# Patient Record
Sex: Female | Born: 1975
Health system: Southern US, Community
[De-identification: ages and names within clinical notes are randomized; demographics above are authoritative.]

## PROBLEM LIST (undated history)

## (undated) HISTORY — PX: BREAST SURGERY: SHX581

## (undated) HISTORY — PX: COSMETIC SURGERY: SHX468

---

## 2004-11-25 ENCOUNTER — Inpatient Hospital Stay: Payer: Self-pay | Admitting: Obstetrics & Gynecology

## 2005-03-08 ENCOUNTER — Inpatient Hospital Stay: Payer: Self-pay | Admitting: Psychiatry

## 2005-03-18 ENCOUNTER — Inpatient Hospital Stay: Payer: Self-pay | Admitting: Psychiatry

## 2011-08-26 ENCOUNTER — Ambulatory Visit (INDEPENDENT_AMBULATORY_CARE_PROVIDER_SITE_OTHER): Payer: Self-pay | Admitting: Internal Medicine

## 2011-08-26 ENCOUNTER — Encounter: Payer: Self-pay | Admitting: Internal Medicine

## 2011-08-26 ENCOUNTER — Other Ambulatory Visit (HOSPITAL_COMMUNITY)
Admission: RE | Admit: 2011-08-26 | Discharge: 2011-08-26 | Disposition: A | Discharge status: Home / Self Care | Payer: Self-pay | Source: Ambulatory Visit | Attending: Internal Medicine | Admitting: Internal Medicine

## 2011-08-26 ENCOUNTER — Telehealth: Payer: Self-pay | Admitting: Internal Medicine

## 2011-08-26 VITALS — BP 108/60 | HR 74 | Temp 97.8°F | Resp 16 | Ht 70.0 in | Wt 138.8 lb

## 2011-08-26 DIAGNOSIS — Z124 Encounter for screening for malignant neoplasm of cervix: Secondary | ICD-10-CM

## 2011-08-26 DIAGNOSIS — F32A Depression, unspecified: Secondary | ICD-10-CM

## 2011-08-26 DIAGNOSIS — Z0000 Encounter for general adult medical examination without abnormal findings: Secondary | ICD-10-CM

## 2011-08-26 DIAGNOSIS — Z01419 Encounter for gynecological examination (general) (routine) without abnormal findings: Secondary | ICD-10-CM | POA: Insufficient documentation

## 2011-08-26 DIAGNOSIS — F329 Major depressive disorder, single episode, unspecified: Secondary | ICD-10-CM

## 2011-08-26 DIAGNOSIS — Z1159 Encounter for screening for other viral diseases: Secondary | ICD-10-CM | POA: Insufficient documentation

## 2011-08-26 MED ORDER — FLUOXETINE HCL 60 MG PO TABS: 60.0000 mg | ORAL_TABLET | Freq: Every day | ORAL | Status: DC

## 2011-08-26 MED ORDER — FLUOXETINE HCL 60 MG PO TABS: 20.0000 mg | ORAL_TABLET | Freq: Every day | ORAL | Status: DC

## 2011-08-26 NOTE — Telephone Encounter (Signed)
60 mg is her dose.  New rx sent.  Thank you

## 2011-08-26 NOTE — Telephone Encounter (Signed)
Caller: Crystal; Pharmacy: [facility] Phone Number: [phone]; Prescriber: Dr. Darrick Huntsman; Medication(s): Fluoxetine 60mg , dose 20mg , #90, Fluoxetine 20mg  tab 1 daily. Pt was taking Fluoxetine 60mg  daily. Pls call to verify dose. Capsules requested.

## 2011-08-26 NOTE — Progress Notes (Signed)
Patient ID: [name], female   DOB: 10-29-1975, 36 y.o.   MRN: [id number]  Patient Active Problem List  Diagnoses  . Depression  . Annual physical exam    Subjective:  CC:   Chief Complaint  Patient presents with  . Gynecologic Exam    HPI:   Kendra Mckinney a 36 y.o. female who presents  History reviewed. No pertinent past medical history.  History reviewed. No pertinent past surgical history.       The following portions of the patient's history were reviewed and updated as appropriate: Allergies, current medications, and problem list.    Review of Systems:   12 Pt  review of systems was negative except those addressed in the HPI,     History   Social History  . Marital Status: Married    Spouse Name: N/A    Number of Children: N/A  . Years of Education: N/A   Occupational History  . Not on file.   Social History Main Topics  . Smoking status: Never Smoker   . Smokeless tobacco: Never Used  . Alcohol Use: No  . Drug Use: No  . Sexually Active: Not on file   Other Topics Concern  . Not on file   Social History Narrative  . No narrative on file    Objective:  BP 108/60  Pulse 74  Temp(Src) 97.8 F (36.6 C) (Oral)  Resp 16  Ht 5\' 10"  (1.778 m)  Wt 138 lb 12 oz (62.937 kg)  BMI 19.91 kg/m2  SpO2 98%  LMP 08/19/2011  .BP 108/60  Pulse 74  Temp(Src) 97.8 F (36.6 C) (Oral)  Resp 16  Ht 5\' 10"  (1.778 m)  Wt 138 lb 12 oz (62.937 kg)  BMI 19.91 kg/m2  SpO2 98%  LMP 08/19/2011  General Appearance:    Alert, cooperative, no distress, appears stated age  Head:    Normocephalic, without obvious abnormality, atraumatic  Eyes:    PERRL, conjunctiva/corneas clear, EOM's intact, fundi    benign, both eyes  Ears:    Normal TM's and external ear canals, both ears  Nose:   Nares normal, septum midline, mucosa normal, no drainage    or sinus tenderness  Throat:   Lips, mucosa, and tongue normal; teeth and gums normal  Neck:   Supple, symmetrical,  trachea midline, no adenopathy;    thyroid:  no enlargement/tenderness/nodules; no carotid   bruit or JVD  Back:     Symmetric, no curvature, ROM normal, no CVA tenderness  Lungs:     Clear to auscultation bilaterally, respirations unlabored  Chest Wall:    No tenderness or deformity   Heart:    Regular rate and rhythm, S1 and S2 normal, no murmur, rub   or gallop  Breast Exam:    No tenderness, masses, or nipple abnormality  Abdomen:     Soft, non-tender, bowel sounds active all four quadrants,    no masses, no organomegaly  Genitalia:    Normal female without lesion, discharge or tenderness     Extremities:   Extremities normal, atraumatic, no cyanosis or edema  Pulses:   2+ and symmetric all extremities  Skin:   Skin color, texture, turgor normal, no rashes or lesions  Lymph nodes:   Cervical, supraclavicular, and axillary nodes normal  Neurologic:   CNII-XII intact, normal strength, sensation and reflexes    throughout    Assessment and Plan:  Depression Chronic, managed with prozac.  She has attempted to wean but  was unsuccessful  Annual physical exam PAP and breast exam were done today.     Updated Medication List Outpatient Encounter Prescriptions as of 08/26/2011  Medication Sig Dispense Refill  . DISCONTD: FLUoxetine (PROZAC) 20 MG tablet Take 20 mg by mouth daily.      Marland Kitchen DISCONTD: FLUoxetine 60 MG TABS Take 20 mg by mouth daily.  90 tablet  2     No orders of the defined types were placed in this encounter.    No Follow-up on file.

## 2011-08-26 NOTE — Patient Instructions (Signed)
claritin = loratidine Zyrtec = cetirizine Allegra = fexofenadine   Choose 1,  Use daily to prevention of allergy related symptoms   Use Simply Saline twice daily to lavage your sinuses and remove pollen from your nose

## 2011-08-29 ENCOUNTER — Encounter: Payer: Self-pay | Admitting: Internal Medicine

## 2011-08-29 DIAGNOSIS — F418 Other specified anxiety disorders: Secondary | ICD-10-CM | POA: Insufficient documentation

## 2011-08-29 DIAGNOSIS — Z0000 Encounter for general adult medical examination without abnormal findings: Secondary | ICD-10-CM | POA: Insufficient documentation

## 2011-08-29 DIAGNOSIS — F329 Major depressive disorder, single episode, unspecified: Secondary | ICD-10-CM | POA: Insufficient documentation

## 2011-08-29 NOTE — Assessment & Plan Note (Signed)
Chronic, managed with prozac.  She has attempted to wean but was unsuccessful

## 2011-08-29 NOTE — Assessment & Plan Note (Signed)
PAP and breast exam were done today.

## 2011-12-17 ENCOUNTER — Encounter: Payer: Self-pay | Admitting: Internal Medicine

## 2012-06-02 ENCOUNTER — Ambulatory Visit: Payer: Self-pay

## 2012-07-11 ENCOUNTER — Observation Stay: Payer: Self-pay | Admitting: Obstetrics and Gynecology

## 2012-07-11 LAB — PROTEIN / CREATININE RATIO, URINE
Creatinine, Urine: 39.9 mg/dL (ref 30.0–125.0)
Protein, Random Urine: 11 mg/dL (ref 0–12)
Protein/Creat. Ratio: 276 mg/gCREAT — ABNORMAL HIGH (ref 0–200)

## 2012-07-15 ENCOUNTER — Observation Stay: Payer: Self-pay | Admitting: Obstetrics & Gynecology

## 2012-07-15 ENCOUNTER — Inpatient Hospital Stay: Payer: Self-pay

## 2012-07-15 LAB — CBC WITH DIFFERENTIAL/PLATELET
Basophil #: 0.1 10*3/uL (ref 0.0–0.1)
Basophil %: 0.5 %
Eosinophil #: 0 10*3/uL (ref 0.0–0.7)
Eosinophil %: 0.3 %
HCT: 31.5 % — ABNORMAL LOW (ref 35.0–47.0)
HGB: 10.6 g/dL — ABNORMAL LOW (ref 12.0–16.0)
Lymphocyte #: 2.2 10*3/uL (ref 1.0–3.6)
MCH: 29.8 pg (ref 26.0–34.0)
MCV: 89 fL (ref 80–100)
Monocyte #: 1 x10 3/mm — ABNORMAL HIGH (ref 0.2–0.9)
Monocyte %: 6.5 %
Neutrophil %: 78.4 %
RBC: 3.55 10*6/uL — ABNORMAL LOW (ref 3.80–5.20)
RDW: 15.6 % — ABNORMAL HIGH (ref 11.5–14.5)
WBC: 15.3 10*3/uL — ABNORMAL HIGH (ref 3.6–11.0)

## 2012-07-17 LAB — HEMATOCRIT: HCT: 32.2 % — ABNORMAL LOW (ref 35.0–47.0)

## 2012-10-05 ENCOUNTER — Other Ambulatory Visit: Payer: Self-pay | Admitting: *Deleted

## 2012-10-05 DIAGNOSIS — F329 Major depressive disorder, single episode, unspecified: Secondary | ICD-10-CM

## 2012-10-09 NOTE — Telephone Encounter (Signed)
Please advise as to refill. 

## 2012-10-19 ENCOUNTER — Ambulatory Visit (INDEPENDENT_AMBULATORY_CARE_PROVIDER_SITE_OTHER): Payer: 59 | Admitting: Internal Medicine

## 2012-10-19 ENCOUNTER — Encounter: Payer: Self-pay | Admitting: Internal Medicine

## 2012-10-19 VITALS — BP 108/62 | HR 87 | Temp 98.2°F | Resp 16 | Wt 162.8 lb

## 2012-10-19 DIAGNOSIS — R05 Cough: Secondary | ICD-10-CM

## 2012-10-19 LAB — CBC WITH DIFFERENTIAL/PLATELET
Eosinophils Absolute: 0.3 10*3/uL (ref 0.0–0.7)
MCHC: 33.7 g/dL (ref 30.0–36.0)
MCV: 90.4 fl (ref 78.0–100.0)
Monocytes Absolute: 0.5 10*3/uL (ref 0.1–1.0)
Neutrophils Relative %: 43.7 % (ref 43.0–77.0)
Platelets: 265 10*3/uL (ref 150.0–400.0)

## 2012-10-19 MED ORDER — MOMETASONE FUROATE 50 MCG/ACT NA SUSP: NASAL | Status: DC

## 2012-10-19 MED ORDER — BENZONATATE 200 MG PO CAPS: 200.0000 mg | ORAL_CAPSULE | Freq: Three times a day (TID) | ORAL | Status: DC | PRN

## 2012-10-19 MED ORDER — MONTELUKAST SODIUM 10 MG PO TABS: 10.0000 mg | ORAL_TABLET | Freq: Every day | ORAL | Status: DC

## 2012-10-19 MED ORDER — PANTOPRAZOLE SODIUM 40 MG PO TBEC: 40.0000 mg | DELAYED_RELEASE_TABLET | Freq: Every day | ORAL | Status: DC

## 2012-10-19 NOTE — Patient Instructions (Addendum)
We are going to investigate your cough which can be caused by several (and any combination of) disorders:  Asthma Post nasal drip Seasonal allergies Reflux Medications   Serologies today for signs of allergies and hypersensitivities to molds Start singulair, protonix and nasonex Alternative to pronotix:  Nexium.  Omeprazole,  Aciphex,  Dexilant, prevacid (all are PPIs)  Tessalon capsules every 8 hours for cough   Repeat chest x ray to be done at [facility] office (no appt needed open 8 to 5)  Return in two weeks.  Pulmonary function tests if cough persists.

## 2012-10-19 NOTE — Progress Notes (Signed)
Patient ID: Kendra Mckinney, female   DOB: 04-11-1976, 37 y.o.   MRN: [id number]  Patient Active Problem List   Diagnosis Date Noted  . Cough 10/21/2012  . Depression 08/29/2011  . Annual physical exam 08/29/2011    Subjective:  CC:   Chief Complaint  Patient presents with  . Acute Visit    cough since 1/14 dry cough at first, then congestion, saw by GYN office.     HPI:   Kendra Mckinney a 37 y.o. female who presents persistent cough.  Has been coughing since January.  Positive sick contacts. daughter  was dxd with flu on Christmas Eve.  Patient was  pregnant  And treated by OB GYN  with tussionex, antibiotics and  albuterol inhaler for wheezing and persistent cough.  Had 3 treatments for perwsistent cough,  delivered march 23rd .  One month later developed body aches and flu like symptoms accompanied by  Fullness and ear  popping along with episodes of dizziness with near syncope.  Was treated for otitis media. X rays done in February at [facility] were normal, . So chest x ray was not repeated in March . Marland Kitchen Was treated with steroid taper, abx, and allegra d.  No serology done for whooping cough because she was up to date on TDaP. Pregnancy was uncomplicated.    For the past 2 months has noted blood streaked nasal drainage and paroxysms of productive cough. Cough is brought on by lying down.,  But lots of nasal drainage.   Stopped allegra D due to cost one week ago. No significant change but having headaches daily releived by ibuprofen.  LNs have been swollen.    Has history of seasonal allergies,  Since teenage used claritin d , but for the past few years only in springtime.  No allergy testing in years. Has had cats for years.  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 No recent travel , No history of smoking     History reviewed. No pertinent past medical history.  History reviewed. No pertinent past surgical history.     The  following portions of the patient's history were reviewed and updated as appropriate: Allergies, current medications, and problem list.    Review of Systems:   12 Pt  review of systems was negative except those addressed in the HPI,     History   Social History  . Marital Status: Married    Spouse Name: N/A    Number of Children: N/A  . Years of Education: N/A   Occupational History  . Not on file.   Social History Main Topics  . Smoking status: Never Smoker   . Smokeless tobacco: Never Used  . Alcohol Use: No  . Drug Use: No  . Sexually Active: Not on file   Other Topics Concern  . Not on file   Social History Narrative  . No narrative on file    Objective:  BP 108/62  Pulse 87  Temp(Src) 98.2 F (36.8 C) (Oral)  Resp 16  Wt 162 lb 12 oz (73.823 kg)  BMI 23.35 kg/m2  SpO2 99%  LMP 09/17/2012  General appearance: alert, cooperative and appears stated age Ears: normal TM's and external ear canals both ears Throat: lips, mucosa, and tongue normal; teeth and gums normal Neck: no adenopathy, no carotid bruit, supple, symmetrical, trachea midline and thyroid not enlarged, symmetric, no tenderness/mass/nodules Back: symmetric, no curvature. ROM normal.  No CVA tenderness. Lungs: clear to auscultation bilaterally Heart: regular rate and rhythm, S1, S2 normal, no murmur, click, rub or gallop Abdomen: soft, non-tender; bowel sounds normal; no masses,  no organomegaly Pulses: 2+ and symmetric Skin: Skin color, texture, turgor normal. No rashes or lesions Lymph nodes: Cervical, supraclavicular, and axillary nodes normal.  Assessment and Plan:  Cough Etiology unclear and may include cyclic cough ,  GERD, cardiomyopathy and asthma.  Hypersensitivity panel, CXR, PPI, antihistamine and cough suppressant .  Return in 2 weeks and order pulmonary function tests if persistent.    Updated Medication List Outpatient Encounter Prescriptions as of 10/19/2012  Medication Sig  Dispense Refill  . FLUoxetine HCl 60 MG TABS Take 60 mg by mouth daily.  90 tablet  2  . benzonatate (TESSALON) 200 MG capsule Take 1 capsule (200 mg total) by mouth 3 (three) times daily as needed for cough.  60 capsule  0  . mometasone (NASONEX) 50 MCG/ACT nasal spray 2 squirts in each nostril once a day  17 g  12  . montelukast (SINGULAIR) 10 MG tablet Take 1 tablet (10 mg total) by mouth at bedtime.  30 tablet  3  . pantoprazole (PROTONIX) 40 MG tablet Take 1 tablet (40 mg total) by mouth daily.  30 tablet  3   No facility-administered encounter medications on file as of 10/19/2012.     Orders Placed This Encounter  Procedures  . DG Chest 2 View  . CBC with Differential  . Hypersensitivity pnuemonitis profile    Return in about 2 weeks (around 11/02/2012).

## 2012-10-21 ENCOUNTER — Encounter: Payer: Self-pay | Admitting: Internal Medicine

## 2012-10-21 DIAGNOSIS — R059 Cough, unspecified: Secondary | ICD-10-CM | POA: Insufficient documentation

## 2012-10-21 DIAGNOSIS — R05 Cough: Secondary | ICD-10-CM | POA: Insufficient documentation

## 2012-10-21 NOTE — Assessment & Plan Note (Signed)
Etiology unclear and may include cyclic cough ,  GERD, cardiomyopathy and asthma.  Hypersensitivity panel, CXR, PPI, antihistamine and cough suppressant .  Return in 2 weeks and order pulmonary function tests if persistent.

## 2012-10-25 LAB — HYPERSENSITIVITY PNUEMONITIS PROFILE

## 2012-10-30 ENCOUNTER — Ambulatory Visit (INDEPENDENT_AMBULATORY_CARE_PROVIDER_SITE_OTHER)
Admission: RE | Admit: 2012-10-30 | Discharge: 2012-10-30 | Disposition: A | Discharge status: Home / Self Care | Payer: 59 | Source: Ambulatory Visit | Attending: Internal Medicine | Admitting: Internal Medicine

## 2012-10-30 ENCOUNTER — Telehealth: Payer: Self-pay | Admitting: Internal Medicine

## 2012-10-30 DIAGNOSIS — R05 Cough: Secondary | ICD-10-CM

## 2012-10-30 NOTE — Telephone Encounter (Signed)
Returning your call. °

## 2012-11-01 NOTE — Telephone Encounter (Signed)
Patient C\O cough  Still present has an appointment on 11/03/12. FYI

## 2012-11-03 ENCOUNTER — Encounter: Payer: Self-pay | Admitting: Internal Medicine

## 2012-11-03 ENCOUNTER — Ambulatory Visit (INDEPENDENT_AMBULATORY_CARE_PROVIDER_SITE_OTHER): Payer: 59 | Admitting: Internal Medicine

## 2012-11-03 VITALS — BP 102/58 | HR 85 | Temp 98.1°F | Resp 16 | Wt 161.5 lb

## 2012-11-03 DIAGNOSIS — R05 Cough: Secondary | ICD-10-CM

## 2012-11-03 DIAGNOSIS — J309 Allergic rhinitis, unspecified: Secondary | ICD-10-CM

## 2012-11-03 MED ORDER — HYDROCOD POLST-CHLORPHEN POLST 10-8 MG/5ML PO LQCR: 10.0000 mL | Freq: Every evening | ORAL | Status: DC | PRN

## 2012-11-03 MED ORDER — BUDESONIDE-FORMOTEROL FUMARATE 160-4.5 MCG/ACT IN AERO: 2.0000 | INHALATION_SPRAY | Freq: Two times a day (BID) | RESPIRATORY_TRACT | Status: DC

## 2012-11-03 NOTE — Progress Notes (Signed)
Patient ID: Kendra Mckinney, female   DOB: July 03, 1975, 37 y.o.   MRN: [id number]  Patient Active Problem List   Diagnosis Date Noted  . Cough 10/21/2012  . Depression 08/29/2011  . Annual physical exam 08/29/2011    Subjective:  CC:   Chief Complaint  Patient presents with  . Follow-up    Not as congested today as last two days, patient reports cough productive    HPI:   Kendra Mckinney a 37 y.o. female who presents 2 week follow up on treatment and initial evaluatoin of persistent cough for the last 7 months.  She was treated with singulair, protonix and nasonex.   Reports compliance with singulair and protonix, tessalon but did not use nasonex regularly .CXR was normal,  Hypersensitivity pneumonitis profile was negative,  CBC showed eosinophilia.  She reports no significant change,  Feels like she is still cycling between cough productive of clear sputum followed by cough productive of purulent sputum which began again after developing sinus congestion . COUGH has been productive of discolored sputum for the last 4 days and s severe at times she chokes. She denies facial pain, body aches and fevers. States that she has not had a fever at all during the 7 months of symptoms, but was still treated with antibiotics multiple times by other providers (ENT and Urgent Care) and symptoms would intermittently improve but always return.   She has been using the albuterol inhaler for symptoms of heavy chest which seems to improve her breathing.   Daughter has asthma. She reports that the cough is often triggered by sitting in front of cold air vent, exposure to perfume, tobacco smoke and fumes in someone's car.      History reviewed. No pertinent past medical history.  History reviewed. No pertinent past surgical history.     The following portions of the patient's history were reviewed and updated as appropriate: Allergies, current medications, and problem list.    Review of Systems:   12 Pt  review  of systems was negative except those addressed in the HPI,     History   Social History  . Marital Status: Married    Spouse Name: N/A    Number of Children: N/A  . Years of Education: N/A   Occupational History  . Not on file.   Social History Main Topics  . Smoking status: Never Smoker   . Smokeless tobacco: Never Used  . Alcohol Use: No  . Drug Use: No  . Sexually Active: Not on file   Other Topics Concern  . Not on file   Social History Narrative  . No narrative on file    Objective:  BP 102/58  Pulse 85  Temp(Src) 98.1 F (36.7 C) (Oral)  Resp 16  Wt 161 lb 8 oz (73.256 kg)  BMI 23.17 kg/m2  SpO2 98%  LMP 09/17/2012  General appearance: alert, cooperative and appears stated age Ears: normal TM's and external ear canals both ears Throat: lips, mucosa, and tongue normal; teeth and gums normal Neck: no adenopathy, no carotid bruit, supple, symmetrical, trachea midline and thyroid not enlarged, symmetric, no tenderness/mass/nodules Back: symmetric, no curvature. ROM normal. No CVA tenderness. Lungs: clear to auscultation bilaterally Heart: regular rate and rhythm, S1, S2 normal, no murmur, click, rub or gallop Abdomen: soft, non-tender; bowel sounds normal; no masses,  no organomegaly Pulses: 2+ and symmetric Skin: Skin color, texture, turgor normal. No rashes or lesions Lymph nodes: Cervical, supraclavicular, and axillary nodes normal.  Assessment and Plan:  Cough Suspect asthma given personal history FH of asthma and eosinophilia on  CBC.  Allergy testing and PFTS ordered,  Continue singulair,  Samples of symbicort given to use for 2 week. Tussionex for nighttime cough,  continue PPI    Updated Medication List Outpatient Encounter Prescriptions as of 11/03/2012  Medication Sig Dispense Refill  . benzonatate (TESSALON) 200 MG capsule Take 1 capsule (200 mg total) by mouth 3 (three) times daily as needed for cough.  60 capsule  0  . FLUoxetine HCl 60 MG  TABS Take 60 mg by mouth daily.  90 tablet  2  . mometasone (NASONEX) 50 MCG/ACT nasal spray 2 squirts in each nostril once a day  17 g  12  . montelukast (SINGULAIR) 10 MG tablet Take 1 tablet (10 mg total) by mouth at bedtime.  30 tablet  3  . pantoprazole (PROTONIX) 40 MG tablet Take 1 tablet (40 mg total) by mouth daily.  30 tablet  3  . budesonide-formoterol (SYMBICORT) 160-4.5 MCG/ACT inhaler Inhale 2 puffs into the lungs 2 (two) times daily.  1 Inhaler  12  . chlorpheniramine-HYDROcodone (TUSSIONEX) 10-8 MG/5ML LQCR Take 10 mLs by mouth at bedtime as needed.  240 mL  0   No facility-administered encounter medications on file as of 11/03/2012.     Orders Placed This Encounter  Procedures  . Ambulatory referral to Allergy  . Pulmonary function test    Return in about 2 weeks (around 11/17/2012).

## 2012-11-03 NOTE — Patient Instructions (Addendum)
Continue Singulair and nasonex as directed.  Sudafed PE for the congestion  Tussionex at bedtime for cough and post nasal drip  Referral to Allergist ,  And to [facility] for pulmonary function tests   Trial of symbicort inhaler:  2 puffs twice daily

## 2012-11-05 ENCOUNTER — Encounter: Payer: Self-pay | Admitting: Internal Medicine

## 2012-11-05 NOTE — Assessment & Plan Note (Addendum)
Suspect asthma given personal history FH of asthma and eosinophilia on  CBC.  Allergy testing and PFTS ordered,  Continue singulair,  Samples of symbicort given to use for 2 week. Tussionex for nighttime cough,  continue PPI    

## 2012-11-09 ENCOUNTER — Encounter: Payer: Self-pay | Admitting: Internal Medicine

## 2012-11-14 ENCOUNTER — Ambulatory Visit: Payer: Self-pay | Admitting: Internal Medicine

## 2012-11-16 ENCOUNTER — Telehealth: Payer: Self-pay | Admitting: Internal Medicine

## 2012-11-16 DIAGNOSIS — J45909 Unspecified asthma, uncomplicated: Secondary | ICD-10-CM | POA: Insufficient documentation

## 2012-11-16 DIAGNOSIS — J4520 Mild intermittent asthma, uncomplicated: Secondary | ICD-10-CM

## 2012-11-16 NOTE — Telephone Encounter (Signed)
Her pulmonary function tests suggested that she does have asthma which is responsive to bronchodilator therapy. sHe should continue to use Symbicort Nasonex and Singulair as directed . She needs to have a pro aire inhaler for emergency use. If she would like to see a pulmonologist I will refer her to Dr. Kendrick Fries.

## 2012-12-04 ENCOUNTER — Encounter: Payer: Self-pay | Admitting: Internal Medicine

## 2013-07-04 ENCOUNTER — Telehealth: Payer: Self-pay | Admitting: *Deleted

## 2013-07-04 NOTE — Telephone Encounter (Signed)
Left message for pt to return my call. Chart shows she had been using 60 mg tabs once daily, refill request for 20 mg caps tid. Pt also needs appt with Dr. Darrick Huntsmanullo.

## 2013-07-04 NOTE — Telephone Encounter (Signed)
Refill request  Prozac 20 mg cap  #90  Take three capsules by mouth once daily

## 2013-07-05 NOTE — Telephone Encounter (Signed)
FYI in case you get returned call

## 2013-07-12 NOTE — Telephone Encounter (Signed)
Left message for patient to call office on mobile phone.

## 2013-09-04 ENCOUNTER — Telehealth: Payer: Self-pay | Admitting: Internal Medicine

## 2013-09-04 DIAGNOSIS — F32A Depression, unspecified: Secondary | ICD-10-CM

## 2013-09-04 DIAGNOSIS — F329 Major depressive disorder, single episode, unspecified: Secondary | ICD-10-CM

## 2013-09-04 MED ORDER — FLUOXETINE HCL 60 MG PO TABS: 60.0000 mg | ORAL_TABLET | Freq: Every day | ORAL | Status: DC

## 2013-09-04 NOTE — Telephone Encounter (Signed)
Last 2 visits, 7/14 and 6/14 acute visits, refill or not until appt 09/18/13?

## 2013-09-04 NOTE — Telephone Encounter (Signed)
The patient has set up an appointment for 5.26.15. She is asking if her prescription for FLUoxetine (PROZAC) 20 MG tablet could be called to the pharmacy.

## 2013-09-04 NOTE — Telephone Encounter (Signed)
Ok to refill,  Refill sent  

## 2013-09-18 ENCOUNTER — Encounter: Payer: Self-pay | Admitting: Internal Medicine

## 2013-09-18 ENCOUNTER — Ambulatory Visit (INDEPENDENT_AMBULATORY_CARE_PROVIDER_SITE_OTHER): Payer: No Typology Code available for payment source | Admitting: Internal Medicine

## 2013-09-18 VITALS — BP 102/62 | HR 88 | Temp 98.2°F | Resp 16 | Wt 157.0 lb

## 2013-09-18 DIAGNOSIS — J0100 Acute maxillary sinusitis, unspecified: Secondary | ICD-10-CM

## 2013-09-18 DIAGNOSIS — R5383 Other fatigue: Secondary | ICD-10-CM

## 2013-09-18 DIAGNOSIS — R5381 Other malaise: Secondary | ICD-10-CM

## 2013-09-18 DIAGNOSIS — J45909 Unspecified asthma, uncomplicated: Secondary | ICD-10-CM

## 2013-09-18 DIAGNOSIS — F329 Major depressive disorder, single episode, unspecified: Secondary | ICD-10-CM

## 2013-09-18 DIAGNOSIS — F3289 Other specified depressive episodes: Secondary | ICD-10-CM

## 2013-09-18 DIAGNOSIS — E785 Hyperlipidemia, unspecified: Secondary | ICD-10-CM

## 2013-09-18 DIAGNOSIS — F32A Depression, unspecified: Secondary | ICD-10-CM

## 2013-09-18 LAB — CBC WITH DIFFERENTIAL/PLATELET
BASOS ABS: 0 10*3/uL (ref 0.0–0.1)
Basophils Relative: 0.8 % (ref 0.0–3.0)
EOS PCT: 2.4 % (ref 0.0–5.0)
Eosinophils Absolute: 0.1 10*3/uL (ref 0.0–0.7)
HEMATOCRIT: 39.1 % (ref 36.0–46.0)
Hemoglobin: 13 g/dL (ref 12.0–15.0)
LYMPHS PCT: 30.4 % (ref 12.0–46.0)
Lymphs Abs: 1.4 10*3/uL (ref 0.7–4.0)
MCHC: 33.3 g/dL (ref 30.0–36.0)
MCV: 91.6 fl (ref 78.0–100.0)
Monocytes Absolute: 0.6 10*3/uL (ref 0.1–1.0)
Monocytes Relative: 12.4 % — ABNORMAL HIGH (ref 3.0–12.0)
NEUTROS PCT: 54 % (ref 43.0–77.0)
Neutro Abs: 2.6 10*3/uL (ref 1.4–7.7)
PLATELETS: 240 10*3/uL (ref 150.0–400.0)
RBC: 4.26 Mil/uL (ref 3.87–5.11)
RDW: 13.5 % (ref 11.5–15.5)
WBC: 4.8 10*3/uL (ref 4.0–10.5)

## 2013-09-18 LAB — COMPREHENSIVE METABOLIC PANEL
ALK PHOS: 59 U/L (ref 39–117)
ALT: 21 U/L (ref 0–35)
AST: 28 U/L (ref 0–37)
Albumin: 4 g/dL (ref 3.5–5.2)
BILIRUBIN TOTAL: 0.2 mg/dL (ref 0.2–1.2)
BUN: 15 mg/dL (ref 6–23)
CALCIUM: 9.5 mg/dL (ref 8.4–10.5)
CO2: 29 mEq/L (ref 19–32)
CREATININE: 0.7 mg/dL (ref 0.4–1.2)
Chloride: 103 mEq/L (ref 96–112)
GFR: 96.83 mL/min (ref >60.00)
Glucose, Bld: 78 mg/dL (ref 70–99)
Potassium: 4.6 mEq/L (ref 3.5–5.1)
Sodium: 139 mEq/L (ref 135–145)
Total Protein: 7.6 g/dL (ref 6.0–8.3)

## 2013-09-18 LAB — LIPID PANEL
Cholesterol: 209 mg/dL — ABNORMAL HIGH (ref 0–200)
HDL: 47.7 mg/dL (ref >39.00)
LDL Cholesterol: 142 mg/dL — ABNORMAL HIGH (ref 0–99)
TRIGLYCERIDES: 95 mg/dL (ref 0.0–149.0)
Total CHOL/HDL Ratio: 4
VLDL: 19 mg/dL (ref 0.0–40.0)

## 2013-09-18 LAB — TSH: TSH: 1.7 u[IU]/mL (ref 0.35–4.50)

## 2013-09-18 MED ORDER — MONTELUKAST SODIUM 10 MG PO TABS: 10.0000 mg | ORAL_TABLET | Freq: Every day | ORAL | Status: DC

## 2013-09-18 MED ORDER — LEVOFLOXACIN 500 MG PO TABS: 500.0000 mg | ORAL_TABLET | Freq: Every day | ORAL | Status: DC

## 2013-09-18 MED ORDER — FEXOFENADINE-PSEUDOEPHED ER 180-240 MG PO TB24: 1.0000 | ORAL_TABLET | Freq: Every day | ORAL | Status: DC

## 2013-09-18 MED ORDER — PREDNISONE (PAK) 10 MG PO TABS: ORAL_TABLET | ORAL | Status: DC

## 2013-09-18 NOTE — Assessment & Plan Note (Signed)
Levaquin, predniosne,  Allegra D

## 2013-09-18 NOTE — Addendum Note (Signed)
Addended by: Montine Circle D on: 09/18/2013 01:59 PM   Modules accepted: Orders

## 2013-09-18 NOTE — Patient Instructions (Signed)
You have a sinus infection   .  I am prescribing an antibiotic (levaquin ) and prednisone taper  To manage the infection and the inflammation in your sinuses.   Allegra D once daily  For allergies and congestion singulair for both allergic rhinitis and asthma  We may need to start Symbicort on a daily basis if you are reaching for albuterol more than once a month    Gargle with salt water as needed for sore throat.   Use vicodin for ear pain

## 2013-09-18 NOTE — Assessment & Plan Note (Signed)
She is currently not wheezing and has not used inhaler in several weeks.  Resume singulair,  Prednisone taper for sinusitis,  And warned her that we may need to resume daily Symbicort for use of MDI > 1/month

## 2013-09-18 NOTE — Assessment & Plan Note (Signed)
Suspect asthma given personal history FH of asthma and eosinophilia on  CBC.  Allergy testing and PFTS ordered,  Continue singulair,  Samples of symbicort given to use for 2 week. Tussionex for nighttime cough,  continue PPI

## 2013-09-18 NOTE — Progress Notes (Signed)
Patient ID: Kendra Mckinney, female   DOB: 11/27/1975, 38 y.o.   MRN: [id number]   Patient Active Problem List   Diagnosis Date Noted  . Sinusitis, acute maxillary 09/18/2013  . 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 Cough 10/21/2012  . Depression 08/29/2011  . Annual physical exam 08/29/2011    Subjective:  CC:   Chief Complaint  Patient presents with  . Sinusitis  . Medication Refill    HPI:   Kendra Mckinney is a 38 y.o. female who presents for URI.  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  Her allergies have been really severe this year.    It started with chest tightness and heaviness, which she treated with albuterol no significant change ,and the symptoms lasted for 2 weeks.  No fevers,   Dry cough.  Sinuses congestion and draining blood tinged purulent drainage. Body aches,  No appetite.   History of asthma diagnosed by PFTs last July for recurrent wheezing/cough. Has not used any inhalers since July  until just recently   No past medical history on file.  No past surgical history on file.     The following portions of the patient's history were reviewed and updated as appropriate: Allergies, current medications, and problem list.    Review of Systems:   Patient denies headache, fevers, malaise, unintentional weight loss, skin rash, eye pain, sinus congestion and sinus pain, sore throat, dysphagia,  hemoptysis , cough, dyspnea, wheezing, chest pain, palpitations, orthopnea, edema, abdominal pain, nausea, melena, diarrhea, constipation, flank pain, dysuria, hematuria, urinary  Frequency, nocturia, numbness, tingling, seizures,  Focal weakness, Loss of consciousness,  Tremor, insomnia, depression, anxiety, and suicidal ideation.     History   Social History  . Marital Status: Married    Spouse Name: N/A    Number of Children: N/A  . Years of Education: N/A   Occupational History  . Not on file.   Social  History Main Topics  . Smoking status: Never Smoker   . Smokeless tobacco: Never Used  . Alcohol Use: No  . Drug Use: No  . Sexual Activity: Not on file   Other Topics Concern  . Not on file   Social History Narrative  . No narrative on file    Objective:  Filed Vitals:   09/18/13 1302  BP: 102/62  Pulse: 88  Temp: 98.2 F (36.8 C)  Resp: 16     General appearance: alert, cooperative and appears stated age Ears: normal TM's and external ear canals both ears Throat: lips, mucosa, and tongue normal; teeth and gums normal Neck: no adenopathy, no carotid bruit, supple, symmetrical, trachea midline and thyroid not enlarged, symmetric, no tenderness/mass/nodules Back: symmetric, no curvature. ROM normal. No CVA tenderness. Lungs: clear to auscultation bilaterally Heart: regular rate and rhythm, S1, S2 normal, no murmur, click, rub or gallop Abdomen: soft, non-tender; bowel sounds normal; no masses,  no organomegaly Pulses: 2+ and symmetric Skin: Skin color, texture, turgor normal. No rashes or lesions Lymph nodes: Cervical, supraclavicular, and axillary nodes normal.  Assessment and Plan:  Intrinsic asthma She is currently not wheezing and has not used inhaler in several weeks.  Resume singulair,  Prednisone taper for sinusitis,  And warned her that we may need to resume daily Symbicort for use of MDI > 1/month  Sinusitis, acute maxillary Levaquin, predniosne,  Allegra D  Depression Suspect asthma given personal history FH of asthma and eosinophilia on  CBC.  Allergy testing and PFTS ordered,  Continue singulair,  Samples of symbicort given to use for 2 week. Tussionex for nighttime cough,  continue PPI      Updated Medication List Outpatient Encounter Prescriptions as of 09/18/2013  Medication Sig  . FLUoxetine HCl 60 MG TABS Take 60 mg by mouth daily.  . fexofenadine-pseudoephedrine (ALLEGRA-D ALLERGY & CONGESTION) 180-240 MG per 24 hr tablet Take 1 tablet by mouth  daily.  Marland Kitchen levofloxacin (LEVAQUIN) 500 MG tablet Take 1 tablet (500 mg total) by mouth daily.  . montelukast (SINGULAIR) 10 MG tablet Take 1 tablet (10 mg total) by mouth at bedtime.  . predniSONE (STERAPRED UNI-PAK) 10 MG tablet 6 tablets on Day 1 , then reduce by 1 tablet daily until gone  . [DISCONTINUED] benzonatate (TESSALON) 200 MG capsule Take 1 capsule (200 mg total) by mouth 3 (three) times daily as needed for cough.  . [DISCONTINUED] budesonide-formoterol (SYMBICORT) 160-4.5 MCG/ACT inhaler Inhale 2 puffs into the lungs 2 (two) times daily.  . [DISCONTINUED] chlorpheniramine-HYDROcodone (TUSSIONEX) 10-8 MG/5ML LQCR Take 10 mLs by mouth at bedtime as needed.  . [DISCONTINUED] mometasone (NASONEX) 50 MCG/ACT nasal spray 2 squirts in each nostril once a day  . [DISCONTINUED] montelukast (SINGULAIR) 10 MG tablet Take 1 tablet (10 mg total) by mouth at bedtime.  . [DISCONTINUED] pantoprazole (PROTONIX) 40 MG tablet Take 1 tablet (40 mg total) by mouth daily.     Orders Placed This Encounter  Procedures  . Comprehensive metabolic panel  . CBC with Differential  . TSH  . Lipid panel    No Follow-up on file.

## 2013-09-18 NOTE — Progress Notes (Signed)
Pre visit review using our clinic review tool, if applicable. No additional management support is needed unless otherwise documented below in the visit note. 

## 2013-09-19 ENCOUNTER — Telehealth: Payer: Self-pay | Admitting: *Deleted

## 2013-09-19 NOTE — Telephone Encounter (Signed)
Patient voiced understanding.

## 2013-09-19 NOTE — Telephone Encounter (Signed)
There is a theoretical risk of tendinopathy which is what they are probably warning her about,  But i have prescribed levaquin and prednisone simultaneously  hundreds of times  With no problems.

## 2013-09-19 NOTE — Telephone Encounter (Signed)
Patient left message she read the package insert on the antibiotic prescribed and stated not to take with prednisone, Please advise should patient continue the antibiotic.

## 2013-09-20 ENCOUNTER — Encounter: Payer: Self-pay | Admitting: *Deleted

## 2013-09-24 ENCOUNTER — Telehealth: Payer: Self-pay | Admitting: Internal Medicine

## 2013-09-24 MED ORDER — AMOXICILLIN-POT CLAVULANATE 875-125 MG PO TABS: 1.0000 | ORAL_TABLET | Freq: Two times a day (BID) | ORAL | Status: DC

## 2013-09-24 MED ORDER — GUAIFENESIN-CODEINE 100-10 MG/5ML PO SYRP: 5.0000 mL | ORAL_SOLUTION | Freq: Three times a day (TID) | ORAL | Status: DC | PRN

## 2013-09-24 NOTE — Telephone Encounter (Signed)
Patient stated congestion is worse having headaches, Mucus has turned green in color. Finished steroids today and the antibiotic the patient stopped on Friday due to becoming very nauseated.  Patient coughing while talking on phone very hacking cough. Patient feels like its up in her head can't sleep, pressure around eyes and headaches patient afebrile but has chills.

## 2013-09-24 NOTE — Telephone Encounter (Signed)
Patient needing more medication for sinus infection.

## 2013-09-24 NOTE — Telephone Encounter (Signed)
augmentin will not treat congestion but will replace the levaquin ,  Needs to continue the allegra D Send rx for cheratussin cough syrup to pharmacy

## 2013-09-25 NOTE — Telephone Encounter (Signed)
Left message for pt to return my call. Rx faxed to pharmacy

## 2014-02-26 ENCOUNTER — Other Ambulatory Visit: Payer: Self-pay | Admitting: Internal Medicine

## 2014-08-31 ENCOUNTER — Other Ambulatory Visit: Payer: Self-pay | Admitting: Internal Medicine

## 2014-09-02 NOTE — Telephone Encounter (Signed)
Left message on VM to return call for appoint 

## 2014-09-03 NOTE — H&P (Signed)
L&D Evaluation:  History:  HPI 39 yo G2P1001 [facility] presents with c/o strong regular contractions since 9PM tonight. Onset of contractions early this AM. Was evaluated for labor  this AM and released when cx remained 3cm and ctxs remained mild. No LOF or bleeding. [facility] with a negative first trimester screen, hx of depression/anxiety since age 89, which worsened after her first baby requiring hospitalization. She was taking prozac 60 mgm prior to current pregnancy, but has not been on meds during pregnancy. Was treated for bronchitis at 32 weeks. Hx of SVD at 38 weeks in 2006, delivering a 5-4 baby.  No, lof, no VB, no ctx  [facility] for [facility], otherwise routine   Presents with contractions   Patient's Medical History depression, anxiety   Patient's Surgical History breast augmentation   Medications Pre Natal Vitamins   Allergies NKDA   Social History none   Family History Non-Contributory   ROS:  ROS see HPI   Exam:  Vital Signs stable   Urine Protein not completed   General breathing thru contractions   Mental Status clear   Chest clear   Heart normal sinus rhythm, no murmur/gallop/rubs   Abdomen gravid, tender with contractions   Estimated Fetal Weight Average for gestational age, 177 1/2#   Fetal Position cephalic   Edema 1+   Reflexes 3+   Pelvic no external lesions, 5cm/90% per RN exam   Mebranes Intact   FHT normal rate with no decels, reactive-CAt1   Fetal Heart Rate 125   Ucx irregular, q2-7 min apart   Skin dry   Impression:  Impression IUP at 39 4/7 weeks in early active labor   Plan:  Plan EFM/NST, monitor contractions and for cervical change, epidural for pain.   Electronic Signatures: Trinna BalloonGutierrez, Abhay Godbolt L (CNM)  (Signed 22-Mar-14 23:31)  Authored: L&D Evaluation   Last Updated: 22-Mar-14 23:31 by Trinna BalloonGutierrez, Deshonda Cryderman L (CNM)

## 2014-09-03 NOTE — Telephone Encounter (Signed)
Refill one 30 days only.  Refill sent

## 2014-09-03 NOTE — H&P (Signed)
L&D Evaluation:  History:  HPI 39 yo G2P1001 [facility] presenting after experiencing decreased fetal movement yesterday, good fetal movement today.  No, lof, no VB, no ctx  [facility] for [facility], otherwise routine   Presents with decreased fetal movement   Patient's Medical History depression, anxiety   Patient's Surgical History breast augmentation   Medications Pre Natal Vitamins  tussionex   Allergies NKDA   Social History none   Family History Non-Contributory   ROS:  ROS All systems were reviewed.  HEENT, CNS, GI, GU, Respiratory, CV, Renal and Musculoskeletal systems were found to be normal.   Exam:  Vital Signs BP >140/90  initial BP 142 systolic, remained of serial BP's normotensive   Urine Protein Pr/Cr ratio pending   FHT normal rate with no decels, 120, moderate, +accels, no decels reactive NST   Ucx absent   Impression:  Impression decreased fetal movement   Plan:  Comments - reactive NST patient reassured - routine labor precautions - next appointment 3/20 at 10:00   Electronic Signatures for Addendum Section:  Lorrene ReidStaebler, Ronnette Rump M (MD) (Signed Addendum 18-Mar-14 17:36)  Pr/Cr ratio of 276 normal   Electronic Signatures: Lorrene ReidStaebler, Harolyn Cocker M (MD)  (Signed 18-Mar-14 17:08)  Authored: L&D Evaluation   Last Updated: 18-Mar-14 17:36 by Lorrene ReidStaebler, Rush Salce M (MD)

## 2014-09-03 NOTE — Telephone Encounter (Addendum)
Last OV 5.26.15, last refill 11.3.15. Pt returned call scheduled appoint for 6.1.16. Please advise refill

## 2014-09-25 ENCOUNTER — Other Ambulatory Visit (HOSPITAL_COMMUNITY)
Admission: RE | Admit: 2014-09-25 | Discharge: 2014-09-25 | Disposition: A | Discharge status: Home / Self Care | Payer: No Typology Code available for payment source | Source: Ambulatory Visit | Attending: Internal Medicine | Admitting: Internal Medicine

## 2014-09-25 ENCOUNTER — Ambulatory Visit (INDEPENDENT_AMBULATORY_CARE_PROVIDER_SITE_OTHER): Payer: No Typology Code available for payment source | Admitting: Internal Medicine

## 2014-09-25 ENCOUNTER — Encounter: Payer: Self-pay | Admitting: Internal Medicine

## 2014-09-25 VITALS — BP 114/66 | HR 101 | Temp 98.6°F | Resp 16 | Ht 69.5 in | Wt 157.0 lb

## 2014-09-25 DIAGNOSIS — L989 Disorder of the skin and subcutaneous tissue, unspecified: Secondary | ICD-10-CM

## 2014-09-25 DIAGNOSIS — Z0000 Encounter for general adult medical examination without abnormal findings: Secondary | ICD-10-CM

## 2014-09-25 DIAGNOSIS — N841 Polyp of cervix uteri: Secondary | ICD-10-CM | POA: Diagnosis not present

## 2014-09-25 DIAGNOSIS — Z1151 Encounter for screening for human papillomavirus (HPV): Secondary | ICD-10-CM | POA: Insufficient documentation

## 2014-09-25 DIAGNOSIS — F329 Major depressive disorder, single episode, unspecified: Secondary | ICD-10-CM

## 2014-09-25 DIAGNOSIS — Z1239 Encounter for other screening for malignant neoplasm of breast: Secondary | ICD-10-CM

## 2014-09-25 DIAGNOSIS — F32A Depression, unspecified: Secondary | ICD-10-CM

## 2014-09-25 DIAGNOSIS — N842 Polyp of vagina: Secondary | ICD-10-CM

## 2014-09-25 DIAGNOSIS — Z01419 Encounter for gynecological examination (general) (routine) without abnormal findings: Secondary | ICD-10-CM | POA: Insufficient documentation

## 2014-09-25 MED ORDER — FLUOXETINE HCL 60 MG PO TABS: 1.0000 | ORAL_TABLET | Freq: Every day | ORAL | Status: DC

## 2014-09-25 NOTE — Patient Instructions (Signed)
I am referring you to   [facility] for a skin check Dr Garwin Brothers: [facility] for evaluation of your cervix (i think you have a polyp)   Please return for fasting labs at your convenience  Mammogram will be ordered as well  Health Maintenance Adopting a healthy lifestyle and getting preventive care can go a long way to promote health and wellness. Talk with your health care provider about what schedule of regular examinations is right for you. This is a good chance for you to check in with your provider about disease prevention and staying healthy. In between checkups, there are plenty of things you can do on your own. Experts have done a lot of research about which lifestyle changes and preventive measures are most likely to keep you healthy. Ask your health care provider for more information. WEIGHT AND DIET  Eat a healthy diet  Be sure to include plenty of vegetables, fruits, low-fat dairy products, and lean protein.  Do not eat a lot of foods high in solid fats, added sugars, or salt.  Get regular exercise. This is one of the most important things you can do for your health.  Most adults should exercise for at least 150 minutes each week. The exercise should increase your heart rate and make you sweat (moderate-intensity exercise).  Most adults should also do strengthening exercises at least twice a week. This is in addition to the moderate-intensity exercise.  Maintain a healthy weight  Body mass index (BMI) is a measurement that can be used to identify possible weight problems. It estimates body fat based on height and weight. Your health care provider can help determine your BMI and help you achieve or maintain a healthy weight.  For females 52 years of age and older:   A BMI below 18.5 is considered underweight.  A BMI of 18.5 to 24.9 is normal.  A BMI of 25 to 29.9 is considered overweight.  A BMI of 30 and above is considered obese.  Watch  levels of cholesterol and blood lipids  You should start having your blood tested for lipids and cholesterol at 41 years of age, then have this test every 5 years.  You may need to have your cholesterol levels checked more often if:  Your lipid or cholesterol levels are high.  You are older than 41 years of age.  You are at high risk for heart disease.  CANCER SCREENING   Lung Cancer  Lung cancer screening is recommended for adults 82-56 years old who are at high risk for lung cancer because of a history of smoking.  A yearly low-dose CT scan of the lungs is recommended for people who:  Currently smoke.  Have quit within the past 15 years.  Have at least a 30-pack-year history of smoking. A pack year is smoking an average of one pack of cigarettes a day for 1 year.  Yearly screening should continue until it has been 15 years since you quit.  Yearly screening should stop if you develop a health problem that would prevent you from having lung cancer treatment.  Breast Cancer  Practice breast self-awareness. This means understanding how your breasts normally appear and feel.  It also means doing regular breast self-exams. Let your health care provider know about any changes, no matter how small.  If you are in your 20s or 30s, you should have a clinical breast exam (CBE) by a health care provider every 1-3 years as part of  regular health exam.  If you are 40 or older, have a CBE every year. Also consider having a breast X-ray (mammogram) every year.  If you have a family history of breast cancer, talk to your health care provider about genetic screening.  If you are at high risk for breast cancer, talk to your health care provider about having an MRI and a mammogram every year.  Breast cancer gene (BRCA) assessment is recommended for women who have family members with BRCA-related cancers. BRCA-related cancers include:  Breast.  Ovarian.  Tubal.  Peritoneal cancers.  Results of the  assessment will determine the need for genetic counseling and BRCA1 and BRCA2 testing. Cervical Cancer Routine pelvic examinations to screen for cervical cancer are no longer recommended for nonpregnant women who are considered low risk for cancer of the pelvic organs (ovaries, uterus, and vagina) and who do not have symptoms. A pelvic examination may be necessary if you have symptoms including those associated with pelvic infections. Ask your health care provider if a screening pelvic exam is right for you.   The Pap test is the screening test for cervical cancer for women who are considered at risk.  If you had a hysterectomy for a problem that was not cancer or a condition that could lead to cancer, then you no longer need Pap tests.  If you are older than 65 years, and you have had normal Pap tests for the past 10 years, you no longer need to have Pap tests.  If you have had past treatment for cervical cancer or a condition that could lead to cancer, you need Pap tests and screening for cancer for at least 20 years after your treatment.  If you no longer get a Pap test, assess your risk factors if they change (such as having a new sexual partner). This can affect whether you should start being screened again.  Some women have medical problems that increase their chance of getting cervical cancer. If this is the case for you, your health care provider may recommend more frequent screening and Pap tests.  The human papillomavirus (HPV) test is another test that may be used for cervical cancer screening. The HPV test looks for the virus that can cause cell changes in the cervix. The cells collected during the Pap test can be tested for HPV.  The HPV test can be used to screen women 30 years of age and older. Getting tested for HPV can extend the interval between normal Pap tests from three to five years.  An HPV test also should be used to screen women of any age who have unclear Pap test  results.  After 41 years of age, women should have HPV testing as often as Pap tests.  Colorectal Cancer  This type of cancer can be detected and often prevented.  Routine colorectal cancer screening usually begins at 41 years of age and continues through 41 years of age.  Your health care provider may recommend screening at an earlier age if you have risk factors for colon cancer.  Your health care provider may also recommend using home test kits to check for hidden blood in the stool.  A small camera at the end of a tube can be used to examine your colon directly (sigmoidoscopy or colonoscopy). This is done to check for the earliest forms of colorectal cancer.  Routine screening usually begins at age 50.  Direct examination of the colon should be repeated every 5-10 years through 41   years of age. However, you may need to be screened more often if early forms of precancerous polyps or small growths are found. Skin Cancer  Check your skin from head to toe regularly.  Tell your health care provider about any new moles or changes in moles, especially if there is a change in a mole's shape or color.  Also tell your health care provider if you have a mole that is larger than the size of a pencil eraser.  Always use sunscreen. Apply sunscreen liberally and repeatedly throughout the day.  Protect yourself by wearing long sleeves, pants, a wide-brimmed hat, and sunglasses whenever you are outside. HEART DISEASE, DIABETES, AND HIGH BLOOD PRESSURE   Have your blood pressure checked at least every 1-2 years. High blood pressure causes heart disease and increases the risk of stroke.  If you are between 55 years and 79 years old, ask your health care provider if you should take aspirin to prevent strokes.  Have regular diabetes screenings. This involves taking a blood sample to check your fasting blood sugar level.  If you are at a normal weight and have a low risk for diabetes, have this  test once every three years after 41 years of age.  If you are overweight and have a high risk for diabetes, consider being tested at a younger age or more often. PREVENTING INFECTION  Hepatitis B  If you have a higher risk for hepatitis B, you should be screened for this virus. You are considered at high risk for hepatitis B if:  You were born in a country where hepatitis B is common. Ask your health care provider which countries are considered high risk.  Your parents were born in a high-risk country, and you have not been immunized against hepatitis B (hepatitis B vaccine).  You have HIV or AIDS.  You use needles to inject street drugs.  You live with someone who has hepatitis B.  You have had sex with someone who has hepatitis B.  You get hemodialysis treatment.  You take certain medicines for conditions, including cancer, organ transplantation, and autoimmune conditions. Hepatitis C  Blood testing is recommended for:  Everyone born from 1945 through 1965.  Anyone with known risk factors for hepatitis C. Sexually transmitted infections (STIs)  You should be screened for sexually transmitted infections (STIs) including gonorrhea and chlamydia if:  You are sexually active and are younger than 41 years of age.  You are older than 41 years of age and your health care provider tells you that you are at risk for this type of infection.  Your sexual activity has changed since you were last screened and you are at an increased risk for chlamydia or gonorrhea. Ask your health care provider if you are at risk.  If you do not have HIV, but are at risk, it may be recommended that you take a prescription medicine daily to prevent HIV infection. This is called pre-exposure prophylaxis (PrEP). You are considered at risk if:  You are sexually active and do not regularly use condoms or know the HIV status of your partner(s).  You take drugs by injection.  You are sexually active with  a partner who has HIV. Talk with your health care provider about whether you are at high risk of being infected with HIV. If you choose to begin PrEP, you should first be tested for HIV. You should then be tested every 3 months for as long as you are taking PrEP.    PREGNANCY   If you are premenopausal and you may become pregnant, ask your health care provider about preconception counseling.  If you may become pregnant, take 400 to 800 micrograms (mcg) of folic acid every day.  If you want to prevent pregnancy, talk to your health care provider about birth control (contraception). OSTEOPOROSIS AND MENOPAUSE   Osteoporosis is a disease in which the bones lose minerals and strength with aging. This can result in serious bone fractures. Your risk for osteoporosis can be identified using a bone density scan.  If you are 8 years of age or older, or if you are at risk for osteoporosis and fractures, ask your health care provider if you should be screened.  Ask your health care provider whether you should take a calcium or vitamin D supplement to lower your risk for osteoporosis.  Menopause may have certain physical symptoms and risks.  Hormone replacement therapy may reduce some of these symptoms and risks. Talk to your health care provider about whether hormone replacement therapy is right for you.  HOME CARE INSTRUCTIONS   Schedule regular health, dental, and eye exams.  Stay current with your immunizations.   Do not use any tobacco products including cigarettes, chewing tobacco, or electronic cigarettes.  If you are pregnant, do not drink alcohol.  If you are breastfeeding, limit how much and how often you drink alcohol.  Limit alcohol intake to no more than 1 drink per day for nonpregnant women. One drink equals 12 ounces of beer, 5 ounces of wine, or 1 ounces of hard liquor.  Do not use street drugs.  Do not share needles.  Ask your health care provider for  help if you need support or information about quitting drugs.  Tell your health care provider if you often feel depressed.  Tell your health care provider if you have ever been abused or do not feel safe at home. Document Released: 10/26/2010 Document Revised: 08/27/2013 Document Reviewed: 03/14/2013 [facility] Patient Information 2015 [place], [place]. This information is not intended to replace advice given to you by your health care provider. Make sure you discuss any questions you have with your health care provider.

## 2014-09-25 NOTE — Progress Notes (Signed)
Pre-visit discussion using our clinic review tool. No additional management support is needed unless otherwise documented below in the visit note.  

## 2014-09-26 LAB — CYTOLOGY - PAP

## 2014-09-27 ENCOUNTER — Encounter: Payer: Self-pay | Admitting: *Deleted

## 2014-09-28 NOTE — Progress Notes (Signed)
Patient ID: Kendra Mckinney, female    DOB: 09-09-1975  Age: 39 y.o. MRN: [id number]  The patient is here for annualwellness examination and management of other chronic and acute problems.   The risk factors are reflected in the social history.  The roster of all physicians providing medical care to patient - is listed in the Snapshot section of the chart.  Activities of daily living:  The patient is 100% independent in all ADLs: dressing, toileting, feeding as well as independent mobility  Home safety : The patient has smoke detectors in the home. They wear seatbelts.  There are no firearms at home. There is no violence in the home.   There is no risks for hepatitis, STDs or HIV. There is no   history of blood transfusion. They have no travel history to infectious disease endemic areas of the world.  The patient has seen their dentist in the last six month. They have not seen their eye doctor in the last year. They do not have hearing difficulty lt  They do not  have excessive sun exposure. Discussed the need for sun protection: hats, long sleeves and use of sunscreen if there is significant sun exposure.   Diet: the importance of a healthy diet is discussed. They do have a healthy diet.  The benefits of regular aerobic exercise were discussed. She has not been exercising and has gained weight .    Depression screen: there are no signs or vegative symptoms of depression- irritability, change in appetite, anhedonia, sadness/tearfullness. She continues to take Prozac and has no desire to discontinue the medication  Cognitive assessment: the patient manages all their financial and personal affairs and is actively engaged. They could relate day,date,year and events; recalled 2/3 objects at 3 minutes; performed clock-face test normally.  The following portions of the patient's history were reviewed and updated as appropriate: allergies, current medications, past family history, past medical history,   past surgical history, past social history  and problem list.  Visual acuity was not assessed per patient preference since she has regular follow up with her ophthalmologist. Hearing and body mass index were assessed and reviewed.   During the course of the visit the patient was educated and counseled about appropriate screening and preventive services including : fall prevention , diabetes screening, nutrition counseling, colorectal cancer screening, and recommended immunizations.    CC: The primary encounter diagnosis was Skin lesion. Diagnoses of Cervical polyp, Breast cancer screening, Vaginal polyp, Annual physical exam, and Depression were also pertinent to this visit.  History Kendra Mckinney has no past medical history on file.   She has no past surgical history on file.   Her family history is not on file.She reports that she has never smoked. She has never used smokeless tobacco. She reports that she does not drink alcohol or use illicit drugs.  Outpatient Prescriptions Prior to Visit  Medication Sig Dispense Refill  . FLUoxetine HCl 60 MG TABS TAKE ONE TABLET BY MOUTH ONCE DAILY 60 tablet 0  . amoxicillin-clavulanate (AUGMENTIN) 875-125 MG per tablet Take 1 tablet by mouth 2 (two) times daily. 14 tablet 0  . fexofenadine-pseudoephedrine (ALLEGRA-D ALLERGY & CONGESTION) 180-240 MG per 24 hr tablet Take 1 tablet by mouth daily. 90 tablet 0  . guaiFENesin-codeine (CHERATUSSIN AC) 100-10 MG/5ML syrup Take 5 mLs by mouth 3 (three) times daily as needed for cough. 120 mL 0  . levofloxacin (LEVAQUIN) 500 MG tablet Take 1 tablet (500 mg total) by mouth daily. 7  tablet 0  . montelukast (SINGULAIR) 10 MG tablet Take 1 tablet (10 mg total) by mouth at bedtime. 90 tablet 1  . predniSONE (STERAPRED UNI-PAK) 10 MG tablet 6 tablets on Day 1 , then reduce by 1 tablet daily until gone 21 tablet 0   No facility-administered medications prior to visit.    Review of Systems  Patient denies headache, fevers,  malaise, unintentional weight loss, skin rash, eye pain, sinus congestion and sinus pain, sore throat, dysphagia,  hemoptysis , cough, dyspnea, wheezing, chest pain, palpitations, orthopnea, edema, abdominal pain, nausea, melena, diarrhea, constipation, flank pain, dysuria, hematuria, urinary  Frequency, nocturia, numbness, tingling, seizures,  Focal weakness, Loss of consciousness,  Tremor, insomnia, depression, anxiety, and suicidal ideation.     Objective:  BP 114/66 mmHg  Pulse 101  Temp(Src) 98.6 F (37 C) (Oral)  Resp 16  Ht 5' 9.5" (1.765 m)  Wt 157 lb (71.215 kg)  BMI 22.86 kg/m2  SpO2 98%  LMP 09/04/2014  Physical Exam  General Appearance:    Alert, cooperative, no distress, appears stated age  Head:    Normocephalic, without obvious abnormality, atraumatic  Eyes:    PERRL, conjunctiva/corneas clear, EOM's intact, fundi    benign, both eyes  Ears:    Normal TM's and external ear canals, both ears  Nose:   Nares normal, septum midline, mucosa normal, no drainage    or sinus tenderness  Throat:   Lips, mucosa, and tongue normal; teeth and gums normal  Neck:   Supple, symmetrical, trachea midline, no adenopathy;    thyroid:  no enlargement/tenderness/nodules; no carotid   bruit or JVD  Back:     Symmetric, no curvature, ROM normal, no CVA tenderness  Lungs:     Clear to auscultation bilaterally, respirations unlabored  Chest Wall:    No tenderness or deformity   Heart:    Regular rate and rhythm, S1 and S2 normal, no murmur, rub   or gallop  Breast Exam:    No tenderness, masses, or nipple abnormality  Abdomen:     Soft, non-tender, bowel sounds active all four quadrants,    no masses, no organomegaly  Genitalia:    Pelvic: cervix friable in appearance, external genitalia normal, no adnexal masses or tenderness, no cervical motion tenderness, rectovaginal septum normal, uterus normal size, shape, and consistency and vagina notable for a pedunculated polyp not visible with  speculum but palpable on exam   Extremities:   Extremities normal, atraumatic, no cyanosis or edema  Pulses:   2+ and symmetric all extremities  Skin:   Skin color, texture, turgor normal, no rashes or lesions  Lymph nodes:   Cervical, supraclavicular, and axillary nodes normal  Neurologic:   CNII-XII intact, normal strength, sensation and reflexes    throughout      Assessment & Plan:   Problem List Items Addressed This Visit    Depression    Chronic, managed for years with  Stable dose of prozac.  She has attempted to wean herself from SSRI therapy in the past but symptoms of anhedonia and irritability recurred .          Relevant Medications   FLUoxetine HCl 60 MG TABS   Annual physical exam    Annual wellness  exam was done as well as a comprehensive physical exam  .  During the course of the visit the patient was educated and counseled about appropriate screening and preventive services and screenings were brought up to date for cervical  and breast cancer .  She will return for fasting labs to provide samples for diabetes screening and lipid analysis with projected  10 year  risk for CAD. nutrition counseling, skin cancer screening has been recommended, along with review of the age appropriate recommended immunizations.  Printed recommendations for health maintenance screenings was given.        Vaginal polyp    Referring to Dr Cherly Hensen for further evaluation of abnormal exam suggesting a vaginal polyp and friable cervix.        Other Visit Diagnoses    Skin lesion    -  Primary    Relevant Orders    Ambulatory referral to Dermatology    Breast cancer screening        Relevant Orders    MM DIGITAL SCREENING BILATERAL       I have discontinued Kendra Mckinney predniSONE, fexofenadine-pseudoephedrine, levofloxacin, montelukast, amoxicillin-clavulanate, and guaiFENesin-codeine. I have also changed her FLUoxetine HCl.  Meds ordered this encounter  Medications  . FLUoxetine  HCl 60 MG TABS    Sig: Take 1 tablet by mouth daily.    Dispense:  30 tablet    Refill:  5    Medications Discontinued During This Encounter  Medication Reason  . predniSONE (STERAPRED UNI-PAK) 10 MG tablet Completed Course  . montelukast (SINGULAIR) 10 MG tablet Completed Course  . levofloxacin (LEVAQUIN) 500 MG tablet Completed Course  . guaiFENesin-codeine (CHERATUSSIN AC) 100-10 MG/5ML syrup Completed Course  . fexofenadine-pseudoephedrine (ALLEGRA-D ALLERGY & CONGESTION) 180-240 MG per 24 hr tablet Completed Course  . amoxicillin-clavulanate (AUGMENTIN) 875-125 MG per tablet Completed Course  . FLUoxetine HCl 60 MG TABS Reorder  . guaiFENesin-codeine (CHERATUSSIN AC) 100-10 MG/5ML syrup Completed Course  . levofloxacin (LEVAQUIN) 500 MG tablet Completed Course  . predniSONE (STERAPRED UNI-PAK) 10 MG tablet Completed Course  . amoxicillin-clavulanate (AUGMENTIN) 875-125 MG per tablet Completed Course  . fexofenadine-pseudoephedrine (ALLEGRA-D ALLERGY & CONGESTION) 180-240 MG per 24 hr tablet Completed Course    Follow-up: No Follow-up on file.   Sherlene Shams, MD

## 2014-09-28 NOTE — Assessment & Plan Note (Signed)
Chronic, managed for years with  Stable dose of prozac.  She has attempted to wean herself from SSRI therapy in the past but symptoms of anhedonia and irritability recurred .

## 2014-09-28 NOTE — Assessment & Plan Note (Signed)
Referring to Dr Cherly Hensenousins for further evaluation of abnormal exam suggesting a vaginal polyp and friable cervix.

## 2014-09-28 NOTE — Progress Notes (Signed)
Patient ID: Fredrich Romansva L Seigler, female    DOB: 03/23/1976  Age: 39 y.o. MRN: [id number]  The patient is here for annual Medicare wellness examination and management of other chronic and acute problems.   The risk factors are reflected in the social history.  The roster of all physicians providing medical care to patient - is listed in the Snapshot section of the chart.  Activities of daily living:  The patient is 100% independent in all ADLs: dressing, toileting, feeding as well as independent mobility  Home safety : The patient has smoke detectors in the home. They wear seatbelts.  There are no firearms at home. There is no violence in the home.   There is no risks for hepatitis, STDs or HIV. There is no   history of blood transfusion. They have no travel history to infectious disease endemic areas of the world.  The patient has seen their dentist in the last six month. They have seen their eye doctor in the last year. They admit to slight hearing difficulty with regard to whispered voices and some television programs.  They have deferred audiologic testing in the last year.  They do not  have excessive sun exposure. Discussed the need for sun protection: hats, long sleeves and use of sunscreen if there is significant sun exposure.   Diet: the importance of a healthy diet is discussed. They do have a healthy diet.  The benefits of regular aerobic exercise were discussed. She walks 4 times per week ,  20 minutes.   Depression screen: there are no signs or vegative symptoms of depression- irritability, change in appetite, anhedonia, sadness/tearfullness.  Cognitive assessment: the patient manages all their financial and personal affairs and is actively engaged. They could relate day,date,year and events; recalled 2/3 objects at 3 minutes; performed clock-face test normally.  The following portions of the patient's history were reviewed and updated as appropriate: allergies, current medications, past  family history, past medical history,  past surgical history, past social history  and problem list.  Visual acuity was not assessed per patient preference since she has regular follow up with her ophthalmologist. Hearing and body mass index were assessed and reviewed.   During the course of the visit the patient was educated and counseled about appropriate screening and preventive services including : fall prevention , diabetes screening, nutrition counseling, colorectal cancer screening, and recommended immunizations.    CC: The primary encounter diagnosis was Skin lesion. Diagnoses of Cervical polyp and Breast cancer screening were also pertinent to this visit.  History Carley Hammedva has no past medical history on file.   She has no past surgical history on file.   Her family history is not on file.She reports that she has never smoked. She has never used smokeless tobacco. She reports that she does not drink alcohol or use illicit drugs.  Outpatient Prescriptions Prior to Visit  Medication Sig Dispense Refill  . FLUoxetine HCl 60 MG TABS TAKE ONE TABLET BY MOUTH ONCE DAILY 60 tablet 0  . amoxicillin-clavulanate (AUGMENTIN) 875-125 MG per tablet Take 1 tablet by mouth 2 (two) times daily. 14 tablet 0  . fexofenadine-pseudoephedrine (ALLEGRA-D ALLERGY & CONGESTION) 180-240 MG per 24 hr tablet Take 1 tablet by mouth daily. 90 tablet 0  . guaiFENesin-codeine (CHERATUSSIN AC) 100-10 MG/5ML syrup Take 5 mLs by mouth 3 (three) times daily as needed for cough. 120 mL 0  . levofloxacin (LEVAQUIN) 500 MG tablet Take 1 tablet (500 mg total) by mouth daily. 7 tablet  0  . montelukast (SINGULAIR) 10 MG tablet Take 1 tablet (10 mg total) by mouth at bedtime. 90 tablet 1  . predniSONE (STERAPRED UNI-PAK) 10 MG tablet 6 tablets on Day 1 , then reduce by 1 tablet daily until gone 21 tablet 0   No facility-administered medications prior to visit.    Review of Systems  Objective:  BP 114/66 mmHg  Pulse 101   Temp(Src) 98.6 F (37 C) (Oral)  Resp 16  Ht 5' 9.5" (1.765 m)  Wt 157 lb (71.215 kg)  BMI 22.86 kg/m2  SpO2 98%  LMP 09/04/2014  Physical Exam    Assessment & Plan:   Problem List Items Addressed This Visit    Cervical polyp   Relevant Orders   Ambulatory referral to Gynecology   Cytology - PAP (Completed)    Other Visit Diagnoses    Skin lesion    -  Primary    Relevant Orders    Ambulatory referral to Dermatology    Breast cancer screening        Relevant Orders    MM DIGITAL SCREENING BILATERAL       I have discontinued Ms. Binford predniSONE, fexofenadine-pseudoephedrine, levofloxacin, montelukast, amoxicillin-clavulanate, and guaiFENesin-codeine. I have also changed her FLUoxetine HCl.  Meds ordered this encounter  Medications  . FLUoxetine HCl 60 MG TABS    Sig: Take 1 tablet by mouth daily.    Dispense:  30 tablet    Refill:  5    Medications Discontinued During This Encounter  Medication Reason  . predniSONE (STERAPRED UNI-PAK) 10 MG tablet Completed Course  . montelukast (SINGULAIR) 10 MG tablet Completed Course  . levofloxacin (LEVAQUIN) 500 MG tablet Completed Course  . guaiFENesin-codeine (CHERATUSSIN AC) 100-10 MG/5ML syrup Completed Course  . fexofenadine-pseudoephedrine (ALLEGRA-D ALLERGY & CONGESTION) 180-240 MG per 24 hr tablet Completed Course  . amoxicillin-clavulanate (AUGMENTIN) 875-125 MG per tablet Completed Course  . FLUoxetine HCl 60 MG TABS Reorder    Follow-up: No Follow-up on file.   Sherlene Shams, MD

## 2014-09-28 NOTE — Assessment & Plan Note (Signed)

## 2014-10-04 ENCOUNTER — Telehealth: Payer: Self-pay | Admitting: *Deleted

## 2014-10-04 ENCOUNTER — Other Ambulatory Visit (INDEPENDENT_AMBULATORY_CARE_PROVIDER_SITE_OTHER): Payer: No Typology Code available for payment source

## 2014-10-04 DIAGNOSIS — E559 Vitamin D deficiency, unspecified: Secondary | ICD-10-CM

## 2014-10-04 DIAGNOSIS — Z1159 Encounter for screening for other viral diseases: Secondary | ICD-10-CM

## 2014-10-04 DIAGNOSIS — R5383 Other fatigue: Secondary | ICD-10-CM

## 2014-10-04 DIAGNOSIS — E785 Hyperlipidemia, unspecified: Secondary | ICD-10-CM | POA: Diagnosis not present

## 2014-10-04 LAB — CBC WITH DIFFERENTIAL/PLATELET
Basophils Absolute: 0.1 10*3/uL (ref 0.0–0.1)
Basophils Relative: 1.4 % (ref 0.0–3.0)
Eosinophils Absolute: 0.1 10*3/uL (ref 0.0–0.7)
Eosinophils Relative: 2.2 % (ref 0.0–5.0)
HEMATOCRIT: 37.2 % (ref 36.0–46.0)
Hemoglobin: 12.4 g/dL (ref 12.0–15.0)
Lymphocytes Relative: 51.6 % — ABNORMAL HIGH (ref 12.0–46.0)
Lymphs Abs: 1.9 10*3/uL (ref 0.7–4.0)
MCHC: 33.3 g/dL (ref 30.0–36.0)
MCV: 92.2 fl (ref 78.0–100.0)
Monocytes Absolute: 0.4 10*3/uL (ref 0.1–1.0)
Monocytes Relative: 10.1 % (ref 3.0–12.0)
NEUTROS PCT: 34.7 % — AB (ref 43.0–77.0)
Neutro Abs: 1.3 10*3/uL — ABNORMAL LOW (ref 1.4–7.7)
Platelets: 268 10*3/uL (ref 150.0–400.0)
RBC: 4.03 Mil/uL (ref 3.87–5.11)
RDW: 14.3 % (ref 11.5–15.5)
WBC: 3.7 10*3/uL — ABNORMAL LOW (ref 4.0–10.5)

## 2014-10-04 LAB — TSH: TSH: 1.92 u[IU]/mL (ref 0.35–4.50)

## 2014-10-04 LAB — COMPREHENSIVE METABOLIC PANEL
ALK PHOS: 66 U/L (ref 39–117)
ALT: 17 U/L (ref 0–35)
AST: 25 U/L (ref 0–37)
Albumin: 4.2 g/dL (ref 3.5–5.2)
BUN: 16 mg/dL (ref 6–23)
CO2: 26 mEq/L (ref 19–32)
Calcium: 9 mg/dL (ref 8.4–10.5)
Chloride: 106 mEq/L (ref 96–112)
Creatinine, Ser: 0.72 mg/dL (ref 0.40–1.20)
GFR: 94.79 mL/min (ref >60.00)
Glucose, Bld: 93 mg/dL (ref 70–99)
Potassium: 4 mEq/L (ref 3.5–5.1)
Sodium: 137 mEq/L (ref 135–145)
Total Bilirubin: 0.4 mg/dL (ref 0.2–1.2)
Total Protein: 6.9 g/dL (ref 6.0–8.3)

## 2014-10-04 LAB — LIPID PANEL
Cholesterol: 186 mg/dL (ref 0–200)
HDL: 40.3 mg/dL (ref >39.00)
LDL Cholesterol: 130 mg/dL — ABNORMAL HIGH (ref 0–99)
NonHDL: 145.7
TRIGLYCERIDES: 80 mg/dL (ref 0.0–149.0)
Total CHOL/HDL Ratio: 5
VLDL: 16 mg/dL (ref 0.0–40.0)

## 2014-10-04 LAB — VITAMIN D 25 HYDROXY (VIT D DEFICIENCY, FRACTURES): VITD: 24.09 ng/mL — ABNORMAL LOW (ref 30.00–100.00)

## 2014-10-04 NOTE — Telephone Encounter (Signed)
Labs and dX?  

## 2014-10-05 LAB — HEPATITIS C ANTIBODY: HCV AB: NEGATIVE

## 2014-10-07 ENCOUNTER — Encounter: Payer: Self-pay | Admitting: *Deleted

## 2014-11-01 ENCOUNTER — Ambulatory Visit (INDEPENDENT_AMBULATORY_CARE_PROVIDER_SITE_OTHER): Payer: No Typology Code available for payment source | Admitting: Internal Medicine

## 2014-11-01 ENCOUNTER — Encounter: Payer: Self-pay | Admitting: Internal Medicine

## 2014-11-01 VITALS — BP 106/68 | HR 78 | Temp 97.9°F | Resp 14 | Ht 69.5 in | Wt 152.2 lb

## 2014-11-01 DIAGNOSIS — J0100 Acute maxillary sinusitis, unspecified: Secondary | ICD-10-CM | POA: Insufficient documentation

## 2014-11-01 MED ORDER — METHYLPREDNISOLONE ACETATE 80 MG/ML IJ SUSP
40.0000 mg | Freq: Once | INTRAMUSCULAR | Status: AC
  Administered 2014-11-01: 40 mg via INTRAMUSCULAR

## 2014-11-01 MED ORDER — PREDNISONE 10 MG PO TABS: ORAL_TABLET | ORAL | Status: DC

## 2014-11-01 NOTE — Progress Notes (Signed)
Subjective:  Patient ID: Kendra Mckinney, female    DOB: 10/04/1975  Age: 39 y.o. MRN: [id number]  CC: The encounter diagnosis was Acute maxillary sinusitis, recurrence not specified.  HPI Kendra Mckinney presents for persistent sinus congestion.  Patient developed a  sore throat,  Congestion,  Headaches,  About June 20th, after working in the barn with her horses.  She treated it as allergic rhinitis , and the sore throat resolved, but  Eyes became bloodshot and sinus very congested was given Augmentin by Urgent Care on June 26th.  She has finsihed the course of antibiotics without diarrhea or other complications.  But conitues to be congestied, although her cough has improved.  Lost voice today.    Currently using sudafed/allegra at night, not helping.  Not using nasal sprasy   Outpatient Prescriptions Prior to Visit  Medication Sig Dispense Refill  . FLUoxetine HCl 60 MG TABS Take 1 tablet by mouth daily. 30 tablet 5   No facility-administered medications prior to visit.    Review of Systems;  Patient denies headache, fevers, malaise, unintentional weight loss, skin rash, eye pain, sinus congestion and sinus pain, sore throat, dysphagia,  hemoptysis , cough, dyspnea, wheezing, chest pain, palpitations, orthopnea, edema, abdominal pain, nausea, melena, diarrhea, constipation, flank pain, dysuria, hematuria, urinary  Frequency, nocturia, numbness, tingling, seizures,  Focal weakness, Loss of consciousness,  Tremor, insomnia, depression, anxiety, and suicidal ideation.      Objective:  BP 106/68 mmHg  Pulse 78  Temp(Src) 97.9 F (36.6 C) (Oral)  Resp 14  Ht 5' 9.5" (1.765 m)  Wt 152 lb 4 oz (69.06 kg)  BMI 22.17 kg/m2  SpO2 97%  LMP 10/11/2014 (Approximate)  BP Readings from Last 3 Encounters:  11/01/14 106/68  09/25/14 114/66  09/18/13 102/62    Wt Readings from Last 3 Encounters:  11/01/14 152 lb 4 oz (69.06 kg)  09/25/14 157 lb (71.215 kg)  09/18/13 157 lb (71.215 kg)     General appearance: alert, cooperative and appears stated age Ears: normal TM's and external ear canals both ears Throat: lips, mucosa, and tongue normal; teeth and gums normal Neck: no adenopathy, no carotid bruit, supple, symmetrical, trachea midline and thyroid not enlarged, symmetric, no tenderness/mass/nodules Back: symmetric, no curvature. ROM normal. No CVA tenderness. Lungs: clear to auscultation bilaterally Heart: regular rate and rhythm, S1, S2 normal, no murmur, click, rub or gallop Abdomen: soft, non-tender; bowel sounds normal; no masses,  no organomegaly Pulses: 2+ and symmetric Skin: Skin color, texture, turgor normal. No rashes or lesions Lymph nodes: Cervical, supraclavicular, and axillary nodes normal.  No results found for: HGBA1C  Lab Results  Component Value Date   CREATININE 0.72 10/04/2014   CREATININE 0.7 09/18/2013    Lab Results  Component Value Date   WBC 3.7* 10/04/2014   HGB 12.4 10/04/2014   HCT 37.2 10/04/2014   PLT 268.0 10/04/2014   GLUCOSE 93 10/04/2014   CHOL 186 10/04/2014   TRIG 80.0 10/04/2014   HDL 40.30 10/04/2014   LDLCALC 130* 10/04/2014   ALT 17 10/04/2014   AST 25 10/04/2014   NA 137 10/04/2014   K 4.0 10/04/2014   CL 106 10/04/2014   CREATININE 0.72 10/04/2014   BUN 16 10/04/2014   CO2 26 10/04/2014   TSH 1.92 10/04/2014    No results found.  Assessment & Plan:   Problem List Items Addressed This Visit      Unprioritized   Sinusitis, acute, maxillary - Primary  Already treated with augmentin, ears look fine and throat is fine,  Will treat congestion with prednisone taper ,       Relevant Medications   amoxicillin-clavulanate (AUGMENTIN) 500-125 MG per tablet   pseudoephedrine (SUDAFED) 120 MG 12 hr tablet   predniSONE (DELTASONE) 10 MG tablet   methylPREDNISolone acetate (DEPO-MEDROL) injection 40 mg (Completed)      I am having Kendra Mckinney start on predniSONE. I am also having her maintain her FLUoxetine  HCl, amoxicillin-clavulanate, and pseudoephedrine. We administered methylPREDNISolone acetate.  Meds ordered this encounter  Medications  . amoxicillin-clavulanate (AUGMENTIN) 500-125 MG per tablet    Sig: Take 1 tablet by mouth 2 (two) times daily.  . pseudoephedrine (SUDAFED) 120 MG 12 hr tablet    Sig: Take 120 mg by mouth daily as needed for congestion.  . predniSONE (DELTASONE) 10 MG tablet    Sig: 6 tablets on Day 1 , then reduce by 1 tablet daily until gone    Dispense:  21 tablet    Refill:  0  . methylPREDNISolone acetate (DEPO-MEDROL) injection 40 mg    Sig:     There are no discontinued medications.  Follow-up: No Follow-up on file.   Sherlene Shams, MD

## 2014-11-01 NOTE — Patient Instructions (Addendum)
I am prescribing a prednisone taper to manage the inflammation in your sinuses.  Start it today  .  You do not need any more antibiotics.    I also advise use of the following OTC meds to help with your other symptoms.   Continue Allegra D twice daily  For the allergies and congestion  Flush sinuses twice daily with [facility]'s sinus rinse.  Add Afrin nasal spray twice daily AFTER THE RINSE,  For 5 days only   Please take a probiotic ( Align, Floraque or Culturelle) since you took an antibiotic to prevent a serious antibiotic associated diarrhea  Called clostridium dificile colitis and a vaginal yeast infection

## 2014-11-01 NOTE — Assessment & Plan Note (Addendum)
Already treated with augmentin, ears look fine and throat is fine,  Will treat congestion with prednisone taper ,

## 2014-11-01 NOTE — Progress Notes (Signed)
Pre-visit discussion using our clinic review tool. No additional management support is needed unless otherwise documented below in the visit note.  

## 2015-01-17 IMAGING — CR DG CHEST 2V
1 series · 2 of 2 positions shown · non-contrast
Comparison: none

REASON FOR EXAM: coughing, wheezing, pleuritic pain Shield Abdomen -
Pregnant 33 weeks
COMMENTS:

PROCEDURE:     DXR - DXR CHEST PA (OR AP) AND LATERAL  - June 02, 2012  [DATE]
RESULT:     Lungs clear. Heart size normal. Scoliosis thoracic spine. No
acute bony abnormality.

[Series 1: pa · 0.17mm/px · 2 of 2 slices shown]
[im 1/2]
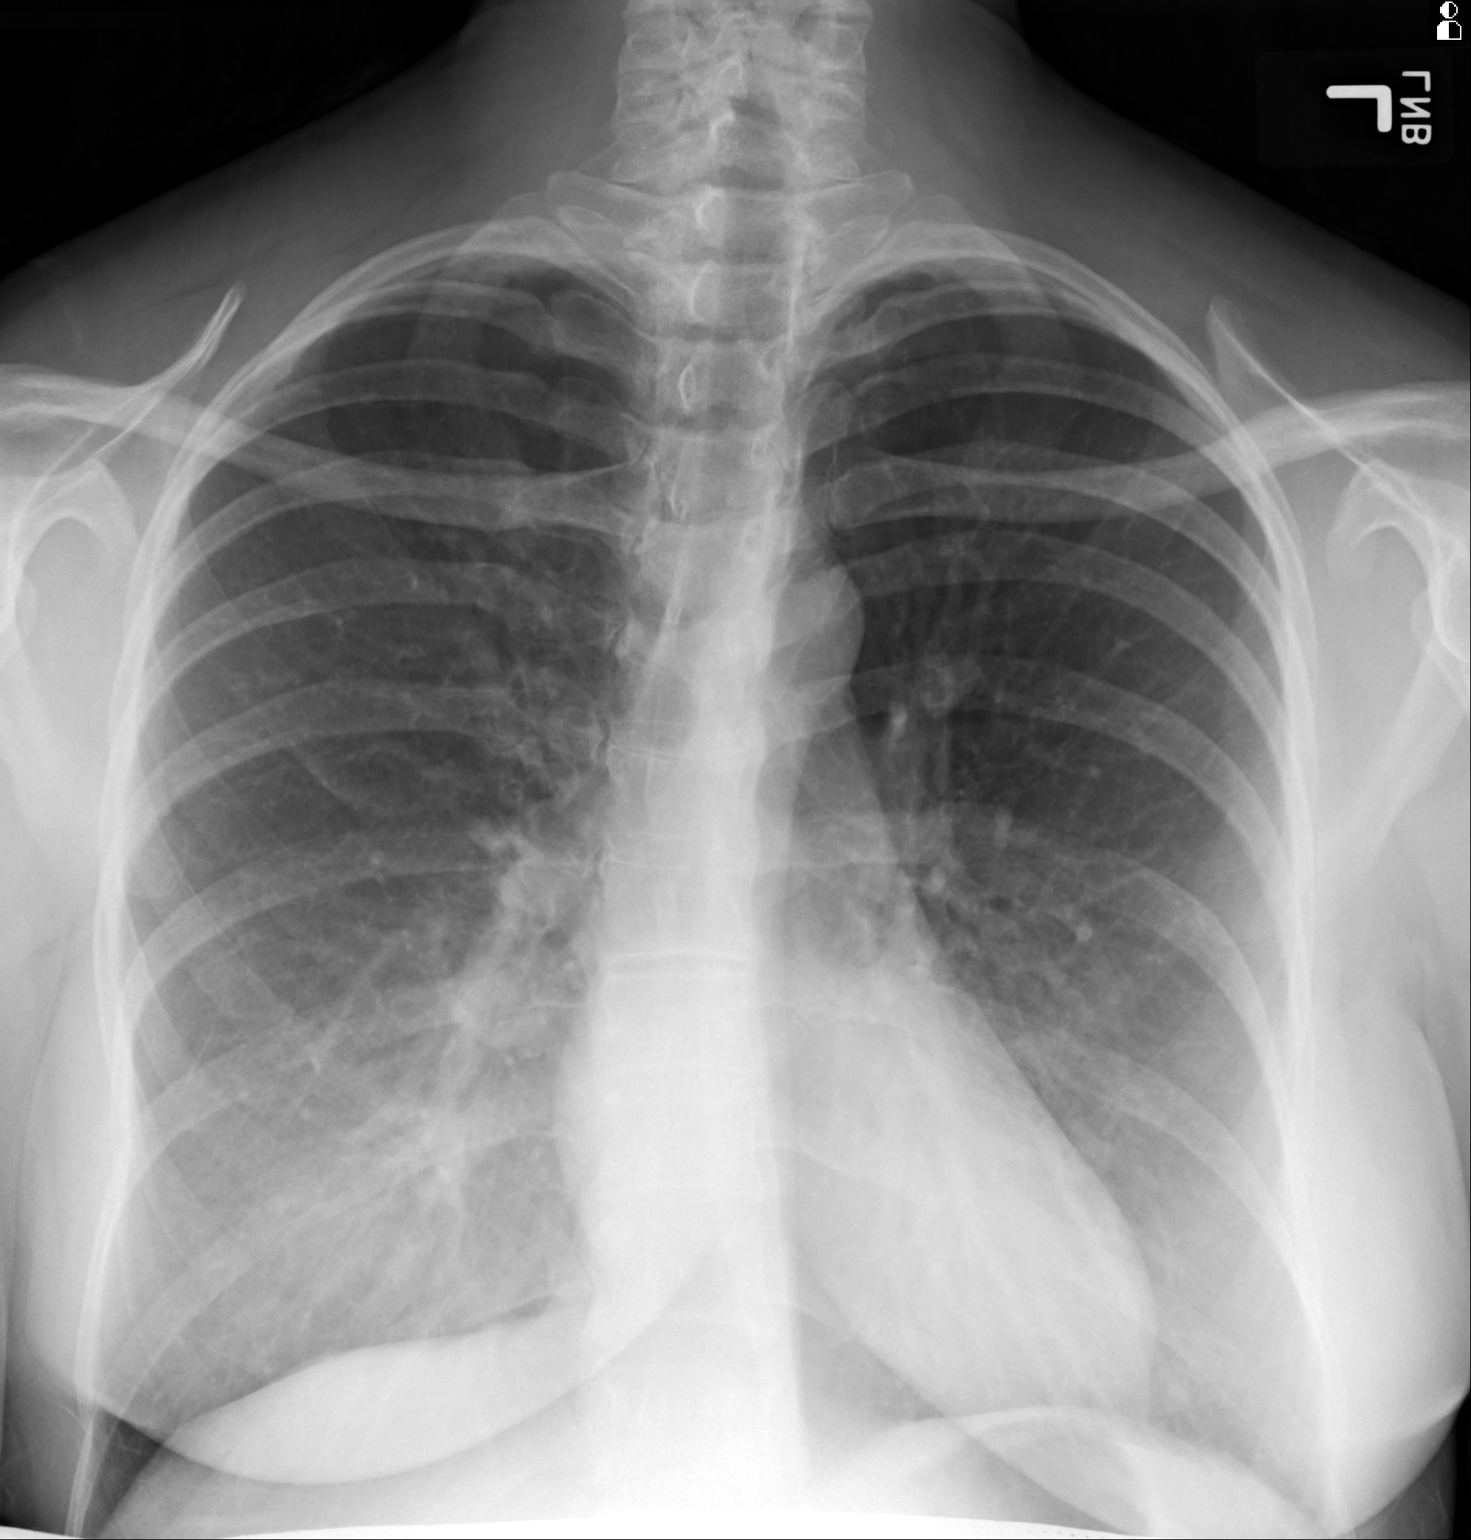
[im 2/2]
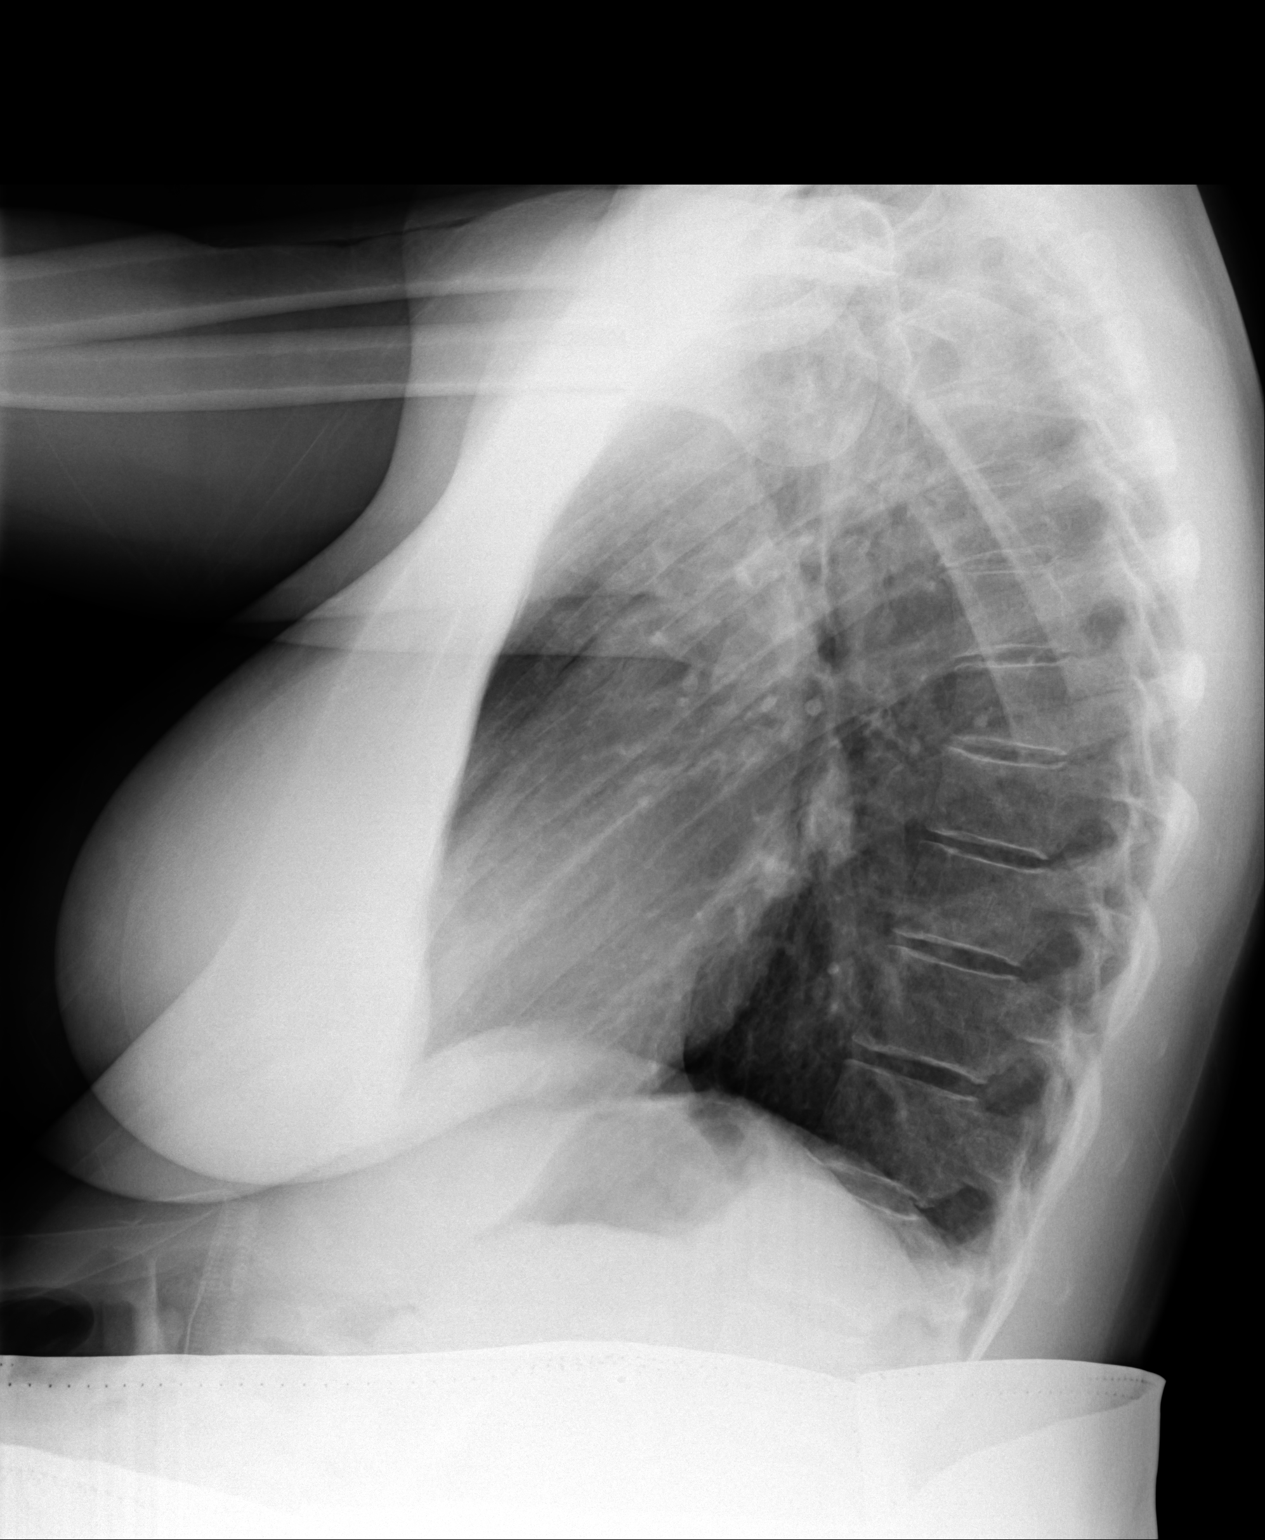

[2 of 2 positions shown; findings below may reference images not displayed]

IMPRESSION: No acute abnormality.

## 2015-10-29 ENCOUNTER — Other Ambulatory Visit: Payer: Self-pay | Admitting: Internal Medicine

## 2015-10-29 NOTE — Telephone Encounter (Signed)
Last OV 11/01/14 ok to fill?

## 2015-10-30 NOTE — Telephone Encounter (Signed)
Refill for 30 days only.  OFFICE VISIT NEEDED prior to any more refills 

## 2015-10-31 NOTE — Telephone Encounter (Signed)
Patient needs an OV for medication refills

## 2016-01-24 ENCOUNTER — Other Ambulatory Visit: Payer: Self-pay | Admitting: Internal Medicine

## 2016-01-28 ENCOUNTER — Telehealth: Payer: Self-pay

## 2016-01-28 MED ORDER — FLUOXETINE HCL 20 MG PO TABS: 60.0000 mg | ORAL_TABLET | Freq: Every day | ORAL | 3 refills | Status: DC

## 2016-01-28 NOTE — Telephone Encounter (Signed)
Please let patient know that the 60 mg tablet is on back order. So she will need to use 3 20 mg tablets for daily use until pharmacy notifies us

## 2016-02-03 NOTE — Telephone Encounter (Signed)
Patient stated she is taking only 40 mg once a day, that she has weaned her self to lower dose. FYI

## 2016-02-03 NOTE — Telephone Encounter (Signed)
Up dated stick y note to remind change during visit.

## 2016-02-03 NOTE — Telephone Encounter (Signed)
Pt called back returning your call.   Call pt @ [phone]

## 2016-02-03 NOTE — Telephone Encounter (Signed)
Left message to call office

## 2016-02-03 NOTE — Telephone Encounter (Signed)
FINE, PLEASE UPDATE MED LIST, SO NEXT TIME RIGHT AMOUTN IS SENT.

## 2016-05-03 ENCOUNTER — Telehealth: Payer: Self-pay | Admitting: Internal Medicine

## 2016-05-03 NOTE — Telephone Encounter (Signed)
Pt came in wanting to know if her Rx FLUoxetine (PROZAC) 20 MG tablet. Pt wants Rx to be changed to 40mg  either a 30 or 90 supply it will be cheaper it will be better if it can be a 90 supply it will save pt 80.00. Pt is only taking 40mg  a day. Pt is out of the medication.  [facility] [address] [facility] [place], [place] - [id number]  Call pt @ [phone]. Thank you!

## 2016-05-05 MED ORDER — FLUOXETINE HCL 20 MG PO TABS: 60.0000 mg | ORAL_TABLET | Freq: Every day | ORAL | 0 refills | Status: DC

## 2016-05-05 NOTE — Telephone Encounter (Signed)
Change rx Prazac 20 mg  To 40 mg  Last OV  11/01/2014  last refill  01/28/2016 No new appt  Please advise

## 2016-05-05 NOTE — Telephone Encounter (Signed)
Patient has requested to have a up date on this medication

## 2016-05-05 NOTE — Telephone Encounter (Signed)
I CANNOT REFILL FOR A HIGHER DOSE WITHOUT SEEING HER.  .  SHE HAS TO BE SEEN EVERY 6 MONTHS AND SHE IS OVER DUE BY ONE YEAR ON THAT SCHEDULE WILL REFILL 60 MG FOR ONE MONTH TO GIVE HER TIME TO MAKE APPT

## 2016-05-06 NOTE — Telephone Encounter (Signed)
Patient is scheduled 06/15/2016 for appt

## 2016-06-15 ENCOUNTER — Ambulatory Visit (INDEPENDENT_AMBULATORY_CARE_PROVIDER_SITE_OTHER): Payer: Commercial Managed Care - HMO | Admitting: Internal Medicine

## 2016-06-15 ENCOUNTER — Encounter: Payer: Self-pay | Admitting: Internal Medicine

## 2016-06-15 VITALS — BP 108/68 | HR 83 | Resp 16 | Ht 69.5 in | Wt 161.0 lb

## 2016-06-15 DIAGNOSIS — Z1231 Encounter for screening mammogram for malignant neoplasm of breast: Secondary | ICD-10-CM

## 2016-06-15 DIAGNOSIS — E7800 Pure hypercholesterolemia, unspecified: Secondary | ICD-10-CM

## 2016-06-15 DIAGNOSIS — J45909 Unspecified asthma, uncomplicated: Secondary | ICD-10-CM

## 2016-06-15 DIAGNOSIS — N926 Irregular menstruation, unspecified: Secondary | ICD-10-CM | POA: Diagnosis not present

## 2016-06-15 DIAGNOSIS — F3341 Major depressive disorder, recurrent, in partial remission: Secondary | ICD-10-CM

## 2016-06-15 DIAGNOSIS — Z0000 Encounter for general adult medical examination without abnormal findings: Secondary | ICD-10-CM | POA: Diagnosis not present

## 2016-06-15 DIAGNOSIS — N842 Polyp of vagina: Secondary | ICD-10-CM

## 2016-06-15 DIAGNOSIS — Z1239 Encounter for other screening for malignant neoplasm of breast: Secondary | ICD-10-CM

## 2016-06-15 LAB — COMPREHENSIVE METABOLIC PANEL
ALT: 24 U/L (ref 0–35)
AST: 29 U/L (ref 0–37)
Albumin: 4.5 g/dL (ref 3.5–5.2)
Alkaline Phosphatase: 57 U/L (ref 39–117)
BUN: 14 mg/dL (ref 6–23)
CO2: 32 meq/L (ref 19–32)
Calcium: 9.5 mg/dL (ref 8.4–10.5)
Chloride: 102 mEq/L (ref 96–112)
Creatinine, Ser: 0.68 mg/dL (ref 0.40–1.20)
GFR: 100.42 mL/min (ref >60.00)
GLUCOSE: 106 mg/dL — AB (ref 70–99)
POTASSIUM: 4.1 meq/L (ref 3.5–5.1)
SODIUM: 139 meq/L (ref 135–145)
Total Bilirubin: 0.3 mg/dL (ref 0.2–1.2)
Total Protein: 7.3 g/dL (ref 6.0–8.3)

## 2016-06-15 LAB — CBC WITH DIFFERENTIAL/PLATELET
BASOS PCT: 1.2 % (ref 0.0–3.0)
Basophils Absolute: 0.1 10*3/uL (ref 0.0–0.1)
Eosinophils Absolute: 0.1 10*3/uL (ref 0.0–0.7)
Eosinophils Relative: 2.5 % (ref 0.0–5.0)
HEMATOCRIT: 36.4 % (ref 36.0–46.0)
Hemoglobin: 12.3 g/dL (ref 12.0–15.0)
LYMPHS PCT: 43.9 % (ref 12.0–46.0)
Lymphs Abs: 2.3 10*3/uL (ref 0.7–4.0)
MCHC: 33.7 g/dL (ref 30.0–36.0)
MCV: 90.9 fl (ref 78.0–100.0)
MONOS PCT: 9.6 % (ref 3.0–12.0)
Monocytes Absolute: 0.5 10*3/uL (ref 0.1–1.0)
NEUTROS ABS: 2.3 10*3/uL (ref 1.4–7.7)
Neutrophils Relative %: 42.8 % — ABNORMAL LOW (ref 43.0–77.0)
PLATELETS: 323 10*3/uL (ref 150.0–400.0)
RBC: 4 Mil/uL (ref 3.87–5.11)
RDW: 13.9 % (ref 11.5–15.5)
WBC: 5.3 10*3/uL (ref 4.0–10.5)

## 2016-06-15 LAB — LDL CHOLESTEROL, DIRECT: Direct LDL: 148 mg/dL

## 2016-06-15 LAB — TSH: TSH: 1.67 u[IU]/mL (ref 0.35–4.50)

## 2016-06-15 MED ORDER — FLUOXETINE HCL 20 MG PO TABS: 40.0000 mg | ORAL_TABLET | Freq: Every day | ORAL | 11 refills | Status: DC

## 2016-06-15 NOTE — Progress Notes (Signed)
Patient ID: Kendra Mckinney, female    DOB: 1975/06/12  Age: 41 y.o. MRN: [id number]  The patient is here for annual Physical examination and management of other chronic and acute problems.    The risk factors are reflected in the social history.  The roster of all physicians providing medical care to patient - is listed in the Snapshot section of the chart.  Activities of daily living:  The patient is 100% independent in all ADLs: dressing, toileting, feeding as well as independent mobility  Home safety : The patient has smoke detectors in the home. They wear seatbelts.  There are no firearms at home. There is no violence in the home.   There is no risks for hepatitis, STDs or HIV. There is no   history of blood transfusion. They have no travel history to infectious disease endemic areas of the world.  The patient has seen their dentist in the last six month. They have seen their eye doctor in the last year. They admit to slight hearing difficulty with regard to whispered voices and some television programs.  They have deferred audiologic testing in the last year.  They do not  have excessive sun exposure. Discussed the need for sun protection: hats, long sleeves and use of sunscreen if there is significant sun exposure.   Diet: the importance of a healthy diet is discussed. They do have a healthy diet.  The benefits of regular aerobic exercise were discussed. She walks 4 times per week ,  20 minutes.   Depression screen: there are no signs or vegative symptoms of depression- irritability, change in appetite, anhedonia, sadness/tearfullness.  Cognitive assessment: the patient manages all their financial and personal affairs and is actively engaged. They could relate day,date,year and events; recalled 2/3 objects at 3 minutes; performed clock-face test normally.  The following portions of the patient's history were reviewed and updated as appropriate: allergies, current medications, past family  history, past medical history,  past surgical history, past social history  and problem list.  Visual acuity was not assessed per patient preference since she has regular follow up with her ophthalmologist. Hearing and body mass index were assessed and reviewed.   During the course of the visit the patient was educated and counseled about appropriate screening and preventive services including : fall prevention , diabetes screening, nutrition counseling, colorectal cancer screening, and recommended immunizations.    CC: The primary encounter diagnosis was Breast cancer screening. Diagnoses of Pure hypercholesterolemia, Menstrual irregularity, Encounter for preventive health examination, Vaginal polyp, Intrinsic asthma, and Recurrent major depressive disorder, in partial remission (HCC) were also pertinent to this visit.   DEVELOPING MENSTRUAL FLOW  IRREGULARITY for the past 4 months. . SPOTS lightly, THEN STOPS, then 2 days later has a lighter than historically usual MENSES .   menses still occurring regularly every  25 TO 28 DAYS    NOT FASTING    History Kendra Mckinney has no past medical history on file.   She has no past surgical history on file.   Her family history is not on file.She reports that she has never smoked. She has never used smokeless tobacco. She reports that she does not drink alcohol or use drugs.  Outpatient Medications Prior to Visit  Medication Sig Dispense Refill  . amoxicillin-clavulanate (AUGMENTIN) 500-125 MG per tablet Take 1 tablet by mouth 2 (two) times daily.    Marland Kitchen FLUoxetine (PROZAC) 20 MG tablet Take 3 tablets (60 mg total) by mouth daily. (Patient taking  differently: Take 40 mg by mouth daily. ) 90 tablet 0  . predniSONE (DELTASONE) 10 MG tablet 6 tablets on Day 1 , then reduce by 1 tablet daily until gone 21 tablet 0  . pseudoephedrine (SUDAFED) 120 MG 12 hr tablet Take 120 mg by mouth daily as needed for congestion.     No facility-administered medications prior to  visit.     Review of Systems   Patient denies headache, fevers, malaise, unintentional weight loss, skin rash, eye pain, sinus congestion and sinus pain, sore throat, dysphagia,  hemoptysis , cough, dyspnea, wheezing, chest pain, palpitations, orthopnea, edema, abdominal pain, nausea, melena, diarrhea, constipation, flank pain, dysuria, hematuria, urinary  Frequency, nocturia, numbness, tingling, seizures,  Focal weakness, Loss of consciousness,  Tremor, insomnia, depression, anxiety, and suicidal ideation.      Objective:  BP 108/68   Pulse 83   Resp 16   Ht 5' 9.5" (1.765 m)   Wt 161 lb (73 kg)   SpO2 95%   BMI 23.43 kg/m   Physical Exam   General appearance: alert, cooperative and appears stated age Head: Normocephalic, without obvious abnormality, atraumatic Eyes: conjunctivae/corneas clear. PERRL, EOM's intact. Fundi benign. Ears: normal TM's and external ear canals both ears Nose: Nares normal. Septum midline. Mucosa normal. No drainage or sinus tenderness. Throat: lips, mucosa, and tongue normal; teeth and gums normal Neck: no adenopathy, no carotid bruit, no JVD, supple, symmetrical, trachea midline and thyroid not enlarged, symmetric, no tenderness/mass/nodules Lungs: clear to auscultation bilaterally Breasts: SALINE IMPLANTS normal appearance, no masses or tenderness Heart: regular rate and rhythm, S1, S2 normal, no murmur, click, rub or gallop Abdomen: soft, non-tender; bowel sounds normal; no masses,  no organomegaly Extremities: extremities normal, atraumatic, no cyanosis or edema Pulses: 2+ and symmetric Skin: Skin color, texture, turgor normal. No rashes or lesions Neurologic: Alert and oriented X 3, normal strength and tone. Normal symmetric reflexes. Normal coordination and gait.      Assessment & Plan:   Problem List Items Addressed This Visit    Depression    Chronic, managed for years with  Stable dose of prozac.  She has attempted to wean herself from  SSRI therapy in the past but symptoms of anhedonia and irritability recurred .          Relevant Medications   FLUoxetine (PROZAC) 20 MG tablet   Encounter for preventive health examination    Annual comprehensive preventive exam was done as well as an evaluation and management of chronic conditions .  During the course of the visit the patient was educated and counseled about appropriate screening and preventive services including :  diabetes screening, lipid analysis with projected  10 year  risk for CAD , nutrition counseling, breast, cervical and colorectal cancer screening, and recommended immunizations.  Printed recommendations for health maintenance screenings was given      Intrinsic asthma    Reviewed use of albuterol MDI .  She averages < 3/month      Vaginal polyp    Benign by biopsy last year ([facility])       Other Visit Diagnoses    Breast cancer screening    -  Primary   Relevant Orders   MM SCREENING BREAST TOMO BILATERAL   Pure hypercholesterolemia       Relevant Orders   LDL cholesterol, direct (Completed)   Menstrual irregularity       Relevant Orders   Comprehensive metabolic panel (Completed)   TSH (Completed)   CBC  with Differential/Platelet (Completed)      I have discontinued Ms. Peake amoxicillin-clavulanate, pseudoephedrine, and predniSONE. I have also changed her FLUoxetine.  Meds ordered this encounter  Medications  . FLUoxetine (PROZAC) 20 MG tablet    Sig: Take 2 tablets (40 mg total) by mouth daily.    Dispense:  60 tablet    Refill:  11    NOTE DOSE CHANGE KEEP ON FILE FOR FUTURE REFILLS    Medications Discontinued During This Encounter  Medication Reason  . amoxicillin-clavulanate (AUGMENTIN) 500-125 MG per tablet Patient has not taken in last 30 days  . predniSONE (DELTASONE) 10 MG tablet Patient has not taken in last 30 days  . pseudoephedrine (SUDAFED) 120 MG 12 hr tablet Patient has not taken in last 30 days  . FLUoxetine  (PROZAC) 20 MG tablet Reorder    Follow-up: No Follow-up on file.   Sherlene Shams, MD

## 2016-06-15 NOTE — Patient Instructions (Signed)

## 2016-06-15 NOTE — Progress Notes (Signed)
Pre visit review using our clinic review tool, if applicable. No additional management support is needed unless otherwise documented below in the visit note. 

## 2016-06-16 NOTE — Assessment & Plan Note (Signed)
Chronic, managed for years with  Stable dose of prozac.  She has attempted to wean herself from SSRI therapy in the past but symptoms of anhedonia and irritability recurred .     

## 2016-06-16 NOTE — Assessment & Plan Note (Signed)
Benign by biopsy last year Psychologist, counselling([facility])

## 2016-06-16 NOTE — Assessment & Plan Note (Signed)
Annual comprehensive preventive exam was done as well as an evaluation and management of chronic conditions .  During the course of the visit the patient was educated and counseled about appropriate screening and preventive services including :  diabetes screening, lipid analysis with projected  10 year  risk for CAD , nutrition counseling, breast, cervical and colorectal cancer screening, and recommended immunizations.  Printed recommendations for health maintenance screenings was given 

## 2016-06-16 NOTE — Assessment & Plan Note (Signed)
Reviewed use of albuterol MDI .  She averages < 3/month 

## 2016-06-18 ENCOUNTER — Encounter: Payer: Self-pay | Admitting: Internal Medicine

## 2016-07-15 ENCOUNTER — Ambulatory Visit: Payer: Commercial Managed Care - HMO | Attending: Internal Medicine

## 2016-08-24 ENCOUNTER — Telehealth: Payer: Self-pay | Admitting: Internal Medicine

## 2016-08-24 NOTE — Telephone Encounter (Signed)
PT called and stated that her medication was called in wrong. It is too expensive.

## 2016-08-24 NOTE — Telephone Encounter (Signed)
Spoke with pt and she stated that her fluoxetine is too expensive but that if we could change it to the capsules it would be a lot cheaper. Spoke with pharmacy and gave them the verbal to change it to capsules using the exact same rx that was sent in on 06/15/2016.

## 2016-10-13 ENCOUNTER — Encounter: Payer: Self-pay | Admitting: Family

## 2016-10-13 ENCOUNTER — Ambulatory Visit (INDEPENDENT_AMBULATORY_CARE_PROVIDER_SITE_OTHER): Payer: 59 | Admitting: Family

## 2016-10-13 VITALS — BP 112/60 | HR 90 | Temp 98.3°F | Ht 69.5 in | Wt 152.0 lb

## 2016-10-13 DIAGNOSIS — J45909 Unspecified asthma, uncomplicated: Secondary | ICD-10-CM | POA: Diagnosis not present

## 2016-10-13 DIAGNOSIS — J309 Allergic rhinitis, unspecified: Secondary | ICD-10-CM | POA: Insufficient documentation

## 2016-10-13 DIAGNOSIS — J301 Allergic rhinitis due to pollen: Secondary | ICD-10-CM | POA: Diagnosis not present

## 2016-10-13 MED ORDER — AMOXICILLIN 500 MG PO CAPS: 500.0000 mg | ORAL_CAPSULE | Freq: Two times a day (BID) | ORAL | 0 refills | Status: DC

## 2016-10-13 MED ORDER — MONTELUKAST SODIUM 10 MG PO TABS: 10.0000 mg | ORAL_TABLET | Freq: Every day | ORAL | 3 refills | Status: DC

## 2016-10-13 NOTE — Patient Instructions (Addendum)
Due to duration of symptoms, will start antibiotic then start singulair.  regimen- mucinex, nasocort, claritin, and nasal saline spray.  Humidifier in bedroom.   Let me know if not better

## 2016-10-13 NOTE — Assessment & Plan Note (Addendum)
Clinically asymptomatic. Controlled. sa02 95.

## 2016-10-13 NOTE — Progress Notes (Signed)
Subjective:    Patient ID: Kendra Mckinney, female    DOB: 07/06/1975, 41 y.o.   MRN: [id number]  CC: Kendra Mckinney is a 41 y.o. female who presents today for an acute visit.    HPI: CC: sinus congestion for 2 months, waxing and waning.  Starts with sore throat, sneezing.   Coughing, ear pressure, nasal drainage affecting her sleep. Nasal drainage goes from clear , runny to thick and  Yellow. Congestion has caused HA. No Ha today. No dizziness, syncope.    Working in old [employer].   Seasonal allergies. Taking nasocort, claritin d with some relief. Tried affrin with some relief as well.\  H/o asthma- well controlled. No recent use of inhaler. Triggered by cold air. No wheezing, sob.       HISTORY:  No past medical history on file. No past surgical history on file. No family history on file.  Allergies: Patient has no known allergies. Current Outpatient Prescriptions on File Prior to Visit  Medication Sig Dispense Refill  . FLUoxetine (PROZAC) 20 MG tablet Take 2 tablets (40 mg total) by mouth daily. 60 tablet 11   No current facility-administered medications on file prior to visit.     Social History  Substance Use Topics  . Smoking status: Never Smoker  . Smokeless tobacco: Never Used  . Alcohol use No    Review of Systems  Constitutional: Negative for chills and fever.  HENT: Positive for congestion, postnasal drip, sinus pain, sneezing and sore throat.   Respiratory: Positive for cough. Negative for shortness of breath and wheezing.   Cardiovascular: Negative for chest pain and palpitations.  Gastrointestinal: Negative for nausea and vomiting.  Neurological: Positive for headaches. Negative for dizziness.      Objective:    BP 112/60   Pulse 90   Temp 98.3 F (36.8 C) (Oral)   Ht 5' 9.5" (1.765 m)   Wt 152 lb (68.9 kg)   SpO2 95%   BMI 22.12 kg/m    Physical Exam  Constitutional: She appears well-developed and well-nourished.  HENT:  Head: Normocephalic  and atraumatic.  Right Ear: Hearing, tympanic membrane, external ear and ear canal normal. No drainage, swelling or tenderness. No foreign bodies. Tympanic membrane is not erythematous and not bulging. No middle ear effusion. No decreased hearing is noted.  Left Ear: Hearing, tympanic membrane, external ear and ear canal normal. No drainage, swelling or tenderness. No foreign bodies. Tympanic membrane is not erythematous and not bulging.  No middle ear effusion. No decreased hearing is noted.  Nose: Nose normal. No rhinorrhea. Right sinus exhibits no maxillary sinus tenderness and no frontal sinus tenderness. Left sinus exhibits no maxillary sinus tenderness and no frontal sinus tenderness.  Mouth/Throat: Uvula is midline, oropharynx is clear and moist and mucous membranes are normal. No oropharyngeal exudate, posterior oropharyngeal edema, posterior oropharyngeal erythema or tonsillar abscesses.  Eyes: Conjunctivae are normal.  Cardiovascular: Regular rhythm, normal heart sounds and normal pulses.   Pulmonary/Chest: Effort normal and breath sounds normal. She has no wheezes. She has no rhonchi. She has no rales.  Lymphadenopathy:       Head (right side): No submental, no submandibular, no tonsillar, no preauricular, no posterior auricular and no occipital adenopathy present.       Head (left side): No submental, no submandibular, no tonsillar, no preauricular, no posterior auricular and no occipital adenopathy present.    She has no cervical adenopathy.  Neurological: She is alert.  Skin: Skin  is warm and dry.  Psychiatric: She has a normal mood and affect. Her speech is normal and behavior is normal. Thought content normal.  Vitals reviewed.      Assessment & Plan:   Problem List Items Addressed This Visit      Respiratory   Intrinsic asthma    Clinically asymptomatic. Controlled. sa02 95.      Relevant Medications   montelukast (SINGULAIR) 10 MG tablet   Allergic rhinitis - Primary      Due to duration of symptoms, will start antibiotic then advised to start singulair. Discussed regimen- mucinex, nasocort, claritin, and nasal saline spray. Return precautions given.       Relevant Medications   amoxicillin (AMOXIL) 500 MG capsule   montelukast (SINGULAIR) 10 MG tablet        I am having [name] start on amoxicillin and montelukast. I am also having her maintain her FLUoxetine.   Meds ordered this encounter  Medications  . amoxicillin (AMOXIL) 500 MG capsule    Sig: Take 1 capsule (500 mg total) by mouth 2 (two) times daily.    Dispense:  14 capsule    Refill:  0    Order Specific Question:   Supervising Provider    Answer:   Duncan Dull L [[id number]]  . montelukast (SINGULAIR) 10 MG tablet    Sig: Take 1 tablet (10 mg total) by mouth at bedtime.    Dispense:  30 tablet    Refill:  3    Order Specific Question:   Supervising Provider    Answer:   Sherlene Shams [[id number]]    Return precautions given.   Risks, benefits, and alternatives of the medications and treatment plan prescribed today were discussed, and patient expressed understanding.   Education regarding symptom management and diagnosis given to patient on AVS.  Continue to follow with Sherlene Shams, MD for routine health maintenance.   Kendra Mckinney and I agreed with plan.   Rennie Plowman, FNP

## 2016-10-13 NOTE — Assessment & Plan Note (Signed)
Due to duration of symptoms, will start antibiotic then advised to start singulair. Discussed regimen- mucinex, nasocort, claritin, and nasal saline spray. Return precautions given.

## 2016-10-13 NOTE — Progress Notes (Signed)
Pre visit review using our clinic review tool, if applicable. No additional management support is needed unless otherwise documented below in the visit note. 

## 2016-12-16 ENCOUNTER — Other Ambulatory Visit: Payer: Commercial Managed Care - HMO

## 2017-01-04 ENCOUNTER — Telehealth: Payer: Self-pay | Admitting: Radiology

## 2017-01-04 DIAGNOSIS — E7800 Pure hypercholesterolemia, unspecified: Secondary | ICD-10-CM

## 2017-01-04 NOTE — Telephone Encounter (Signed)
Let me know if you can see the cmet and lipid panel I just ordered. thanks

## 2017-01-04 NOTE — Telephone Encounter (Signed)
Yes lipid and cmet order is showing. Thank you.

## 2017-01-04 NOTE — Telephone Encounter (Signed)
PT coming in for labs tomorrow, please place future orders. Thank you.  

## 2017-01-04 NOTE — Addendum Note (Signed)
Addended by: Sherlene ShamsULLO, Jenille Laszlo L on: 01/04/2017 08:52 AM   Modules accepted: Orders

## 2017-01-05 ENCOUNTER — Other Ambulatory Visit: Payer: 59

## 2017-02-28 ENCOUNTER — Ambulatory Visit: Payer: Self-pay | Admitting: *Deleted
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
## 2017-02-28 NOTE — Telephone Encounter (Signed)
Left message for patient to return call to office. 

## 2017-02-28 NOTE — Telephone Encounter (Signed)
Please advise 

## 2017-03-04 NOTE — Telephone Encounter (Signed)
Patient scheduled for Monday.

## 2017-03-07 ENCOUNTER — Ambulatory Visit: Payer: 59 | Admitting: Internal Medicine

## 2017-03-08 ENCOUNTER — Other Ambulatory Visit: Payer: Self-pay | Admitting: Internal Medicine

## 2017-07-26 ENCOUNTER — Telehealth: Payer: Self-pay | Admitting: Internal Medicine

## 2017-07-26 NOTE — Telephone Encounter (Signed)
Copied from CRM [phone]#[id number]. Topic: Quick Communication - See Telephone Encounter >> Jul 26, 2017  2:58 PM Waymon AmatoBurton, Donna F wrote: CRM for notification. See Telephone encounter for: 07/26/17.pt is needing a refill on albuteral inhaler  [facility] [address]   Best number [phone]

## 2017-07-26 NOTE — Telephone Encounter (Signed)
Patient called, left VM to return call to the office to speak to the TN to discuss symptoms she's having to need an albuterol inhaler.

## 2017-09-29 ENCOUNTER — Other Ambulatory Visit: Payer: Self-pay | Admitting: Internal Medicine

## 2017-09-30 NOTE — Telephone Encounter (Signed)
Left voicemail for pt to call & schedule an OV. Once appt scheduled, we can refill up until appt date. Pt last seen by PCP in February 2018

## 2017-12-06 ENCOUNTER — Encounter: Payer: Self-pay | Admitting: Internal Medicine

## 2017-12-06 ENCOUNTER — Ambulatory Visit (INDEPENDENT_AMBULATORY_CARE_PROVIDER_SITE_OTHER): Payer: 59 | Admitting: Internal Medicine

## 2017-12-06 VITALS — BP 102/74 | HR 81 | Temp 98.4°F | Resp 15 | Ht 69.5 in | Wt 153.4 lb

## 2017-12-06 DIAGNOSIS — N842 Polyp of vagina: Secondary | ICD-10-CM

## 2017-12-06 DIAGNOSIS — F3341 Major depressive disorder, recurrent, in partial remission: Secondary | ICD-10-CM

## 2017-12-06 DIAGNOSIS — E785 Hyperlipidemia, unspecified: Secondary | ICD-10-CM | POA: Diagnosis not present

## 2017-12-06 DIAGNOSIS — J45909 Unspecified asthma, uncomplicated: Secondary | ICD-10-CM | POA: Diagnosis not present

## 2017-12-06 DIAGNOSIS — N921 Excessive and frequent menstruation with irregular cycle: Secondary | ICD-10-CM

## 2017-12-06 DIAGNOSIS — N926 Irregular menstruation, unspecified: Secondary | ICD-10-CM

## 2017-12-06 MED ORDER — FLUOXETINE HCL 20 MG PO TABS: 40.0000 mg | ORAL_TABLET | Freq: Every day | ORAL | 11 refills | Status: DC

## 2017-12-06 NOTE — Patient Instructions (Signed)
I recommend changing your dosing of Prozac to AFTER DINNER  You can also increase the dose to 60 mg as a  Trial

## 2017-12-06 NOTE — Progress Notes (Signed)
Subjective:  Patient ID: Kendra Mckinney, female    DOB: 02/01/1976  Age: 42 y.o. MRN: [id number]  CC: The primary encounter diagnosis was Recurrent major depressive disorder, in partial remission (HCC). Diagnoses of Intrinsic asthma, Hyperlipidemia LDL goal <160, Menometrorrhagia, Vaginal polyp, and Irregular menses were also pertinent to this visit.  HPI Kendra Mckinney presents for medication refill.  Last seen Feb 2018  History of MDD managed chronically with prozac  40 mg daily   Last PAP 2016, cervical polyp removed Oct 2016 at [facility]  Last mammogram 2012  bilateral diagnostic mammogram at age 42 was recommended but not done  Started having period today . 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  Son is 44 years old, getting ready to start school , and  her 44 yr old bickers with him constantnly.  Feels at times that she wasn't cut out for having two kids.  Has used the "morning after" pill on several occasions    Outpatient Medications Prior to Visit  Medication Sig Dispense Refill  . FLUoxetine (PROZAC) 20 MG tablet Take 2 tablets (40 mg total) by mouth daily. 60 tablet 11  . montelukast (SINGULAIR) 10 MG tablet Take 1 tablet (10 mg total) by mouth at bedtime. (Patient not taking: Reported on 12/06/2017) 30 tablet 3  . amoxicillin (AMOXIL) 500 MG capsule Take 1 capsule (500 mg total) by mouth 2 (two) times daily. (Patient not taking: Reported on 12/06/2017) 14 capsule 0   No facility-administered medications prior to visit.     Review of Systems;  Patient denies headache, fevers, malaise, unintentional weight loss, skin rash, eye pain, sinus congestion and sinus pain, sore throat, dysphagia,  hemoptysis , cough, dyspnea, wheezing, chest pain, palpitations, orthopnea, edema, abdominal pain, nausea, melena, diarrhea, constipation, flank pain, dysuria, hematuria,  urinary  Frequency, nocturia, numbness, tingling, seizures,  Focal weakness, Loss of consciousness,  Tremor, insomnia, depression, anxiety, and suicidal ideation.      Objective:  BP 102/74 (BP Location: Left Arm, Patient Position: Sitting, Cuff Size: Normal)   Pulse 81   Temp 98.4 F (36.9 C) (Oral)   Resp 15   Ht 5' 9.5" (1.765 m)   Wt 153 lb 6.4 oz (69.6 kg)   SpO2 96%   BMI 22.33 kg/m   BP Readings from Last 3 Encounters:  12/06/17 102/74  10/13/16 112/60  06/15/16 108/68    Wt Readings from Last 3 Encounters:  12/06/17 153 lb 6.4 oz (69.6 kg)  10/13/16 152 lb (68.9 kg)  06/15/16 161 lb (73 kg)    General appearance: alert, cooperative and appears stated age Ears: normal TM's and external ear canals both ears Throat: lips, mucosa, and tongue normal; teeth and gums normal Neck: no adenopathy, no carotid bruit, supple, symmetrical, trachea midline and thyroid not enlarged, symmetric, no tenderness/mass/nodules Back: symmetric, no curvature. ROM normal. No CVA tenderness. Lungs: clear to auscultation bilaterally Heart: regular rate and rhythm, S1, S2 normal, no murmur, click, rub or gallop Abdomen: soft, non-tender; bowel sounds normal; no masses,  no organomegaly Pulses: 2+ and symmetric Skin: Skin color, texture, turgor normal. No rashes or lesions Lymph nodes: Cervical, supraclavicular, and axillary nodes normal.  No results found for: HGBA1C  Lab Results  Component Value Date   CREATININE 0.68 06/15/2016   CREATININE 0.72 10/04/2014   CREATININE 0.7 09/18/2013  Lab Results  Component Value Date   WBC 5.3 06/15/2016   HGB 12.3 06/15/2016   HCT 36.4 06/15/2016   PLT 323.0 06/15/2016   GLUCOSE 106 (H) 06/15/2016   CHOL 186 10/04/2014   TRIG 80.0 10/04/2014   HDL 40.30 10/04/2014   LDLDIRECT 148.0 06/15/2016   LDLCALC 130 (H) 10/04/2014   ALT 24 06/15/2016   AST 29 06/15/2016   NA 139 06/15/2016   K 4.1 06/15/2016   CL 102 06/15/2016   CREATININE  0.68 06/15/2016   BUN 14 06/15/2016   CO2 32 06/15/2016   TSH 1.67 06/15/2016    No results found.  Assessment & Plan:   Problem List Items Addressed This Visit    Depression - Primary    Recommended changing dose to evening since she is having trouble sleeping, followed by a  trial of increasing her  dose of prozac to 60 mg daily       Relevant Medications   FLUoxetine (PROZAC) 20 MG tablet   Intrinsic asthma   Vaginal polyp    Removed by Dr Cherly Hensenousins in 2016.  Benign       Irregular menses    Checking thyroid,  FSH and LH to rule out hormonal causes       Relevant Orders   Follicle stimulating hormone   LH    Other Visit Diagnoses    Hyperlipidemia LDL goal <160       Relevant Orders   LDL cholesterol, direct   Lipid panel   Menometrorrhagia       Relevant Orders   Comprehensive metabolic panel   TSH   CBC with Differential/Platelet      I have discontinued Kendra Mckinney's amoxicillin. I am also having her maintain her montelukast and FLUoxetine.  Meds ordered this encounter  Medications  . FLUoxetine (PROZAC) 20 MG tablet    Sig: Take 2 tablets (40 mg total) by mouth daily.    Dispense:  60 tablet    Refill:  11    Medications Discontinued During This Encounter  Medication Reason  . amoxicillin (AMOXIL) 500 MG capsule Completed Course  . FLUoxetine (PROZAC) 20 MG tablet Reorder    Follow-up: No follow-ups on file.   Sherlene Shamseresa L Jamey Demchak, MD

## 2017-12-07 DIAGNOSIS — N926 Irregular menstruation, unspecified: Secondary | ICD-10-CM | POA: Insufficient documentation

## 2017-12-07 NOTE — Assessment & Plan Note (Addendum)
Recommended changing dose to evening since she is having trouble sleeping, followed by a  trial of increasing her  dose of prozac to 60 mg daily

## 2017-12-07 NOTE — Assessment & Plan Note (Signed)
Removed by Dr Cherly Hensenousins in 2016.  Benign

## 2017-12-07 NOTE — Assessment & Plan Note (Signed)
Checking thyroid,  FSH and LH to rule out hormonal causes

## 2017-12-08 ENCOUNTER — Telehealth: Payer: Self-pay | Admitting: Internal Medicine

## 2017-12-08 DIAGNOSIS — F3341 Major depressive disorder, recurrent, in partial remission: Secondary | ICD-10-CM

## 2017-12-08 NOTE — Telephone Encounter (Signed)
Copied from CRM [id number]. Topic: Quick Communication - Rx Refill/Question >> Dec 08, 2017  2:58 PM Stephannie LiSimmons, Bryana Froemming L, NT wrote: Medication:  FLUoxetine (PROZAC) 20 MG tablet  patient needs these called as capsules due to cost instead of tablets  Has the patient contacted their pharmacy? yes  (Agent: If no, request that the patient contact the pharmacy for the refill. (Agent: If yes, when and what did the pharmacy advise?  Preferred Pharmacy (with phone number or street name [facility] [address] - [place], [place] - [id number] [phone] (Phone) [phone] (Fax    Agent: Please be advised that RX refills may take up to 3 business days. We ask that you follow-up with your pharmacy.

## 2017-12-12 MED ORDER — FLUOXETINE HCL 20 MG PO CAPS: 40.0000 mg | ORAL_CAPSULE | Freq: Every day | ORAL | 3 refills | Status: DC

## 2017-12-12 NOTE — Telephone Encounter (Signed)
Patient notified script called in. 

## 2017-12-12 NOTE — Telephone Encounter (Signed)
Patient is calling for a update in regards to her medication FLUoxetine (PROZAC) 20 MG tablet. She stated the medication was called in wrong and she is needing capsule not tablet. She is requesting a call back in regards to this. Please advise.

## 2017-12-16 ENCOUNTER — Encounter: Payer: 59 | Admitting: Internal Medicine

## 2018-01-18 ENCOUNTER — Encounter: Payer: 59 | Admitting: Internal Medicine

## 2018-01-18 DIAGNOSIS — Z0289 Encounter for other administrative examinations: Secondary | ICD-10-CM

## 2018-07-18 ENCOUNTER — Telehealth (INDEPENDENT_AMBULATORY_CARE_PROVIDER_SITE_OTHER): Payer: 59 | Admitting: Internal Medicine

## 2018-07-18 ENCOUNTER — Ambulatory Visit: Payer: Self-pay | Admitting: *Deleted

## 2018-07-18 ENCOUNTER — Telehealth: Payer: Self-pay | Admitting: Internal Medicine

## 2018-07-18 ENCOUNTER — Ambulatory Visit: Payer: 59 | Admitting: Internal Medicine

## 2018-07-18 ENCOUNTER — Other Ambulatory Visit: Payer: Self-pay

## 2018-07-18 DIAGNOSIS — J4521 Mild intermittent asthma with (acute) exacerbation: Secondary | ICD-10-CM

## 2018-07-18 DIAGNOSIS — J301 Allergic rhinitis due to pollen: Secondary | ICD-10-CM | POA: Diagnosis not present

## 2018-07-18 MED ORDER — FLUOXETINE HCL 20 MG PO CAPS: 40.0000 mg | ORAL_CAPSULE | Freq: Every day | ORAL | 1 refills | Status: DC

## 2018-07-18 MED ORDER — ALBUTEROL SULFATE HFA 108 (90 BASE) MCG/ACT IN AERS: 2.0000 | INHALATION_SPRAY | Freq: Four times a day (QID) | RESPIRATORY_TRACT | 0 refills | Status: DC | PRN

## 2018-07-18 MED ORDER — DOXYCYCLINE HYCLATE 100 MG PO TABS: 100.0000 mg | ORAL_TABLET | Freq: Two times a day (BID) | ORAL | 0 refills | Status: DC

## 2018-07-18 MED ORDER — PREDNISONE 10 MG PO TABS: ORAL_TABLET | ORAL | 0 refills | Status: DC

## 2018-07-18 NOTE — Telephone Encounter (Signed)
Pt. called to report onset of intermittent shortness of breath and chest tightness on 3/22.  Reported she thought it was her allergies at first, but had an episode on Sunday, that scared her.  Reported had episode that she felt heaviness and tightness in chest, and had discomfort in right lung with deep breath on Sunday.  Stated has an occasional cough with production of light yellow mucus.  Denied wheezing.  Denied fever.  Denied any travel or exposure to anyone with COVID 19, in past 14 days.  Stated she was diagnosed with asthma about 6 yrs. Ago.  Restarted using Singulair, recently.  Is requesting Rx for Albuterol Inhaler.  Pt stated she feels fine today; denied any chest tightness or shortness of breath today.  Advised will schedule appt. with PCP @ 4:30 PM, that may be done as a Web Ex or Telephonic appt.  Pt. In agreement with either of the above.  Updated her email address in the demographic information.  Advised will contact the office, and a nurse will call her back re: plan for the 4:30 PM appt. today.  Advised if develops increased shortness of breath or chest tightness, to proceed to ER.  Verb. Understanding.  Agrees with plan.       Reason for Disposition . [1] MILD difficulty breathing (e.g., minimal/no SOB at rest, SOB with walking, pulse <100) AND [2] NEW-onset or WORSE than normal  Answer Assessment - Initial Assessment Questions 1. RESPIRATORY STATUS: "Describe your breathing?" (e.g., wheezing, shortness of breath, unable to speak, severe coughing)      At present, denied SOB; has episode of feeling heaviness in chest and pain in right lung.  2. ONSET: "When did this breathing problem begin?"      Sunday 3. PATTERN "Does the difficult breathing come and go, or has it been constant since it started?"      intermittent 4. SEVERITY: "How bad is your breathing?" (e.g., mild, moderate, severe)    - MILD: No SOB at rest, mild SOB with walking, speaks normally in sentences, can lay down,  no retractions, pulse < 100.    - MODERATE: SOB at rest, SOB with minimal exertion and prefers to sit, cannot lie down flat, speaks in phrases, mild retractions, audible wheezing, pulse 100-120.    - SEVERE: Very SOB at rest, speaks in single words, struggling to breathe, sitting hunched forward, retractions, pulse > 120      Mild to moderate over past 2 days; panicked during episode 5. RECURRENT SYMPTOM: "Have you had difficulty breathing before?" If so, ask: "When was the last time?" and "What happened that time?"     Yes 6. CARDIAC HISTORY: "Do you have any history of heart disease?" (e.g., heart attack, angina, bypass surgery, angioplasty)      *No Answer* 7. LUNG HISTORY: "Do you have any history of lung disease?"  (e.g., pulmonary embolus, asthma, emphysema)     Hx of asthma; diagnosed about 6 yrs. ago  8. CAUSE: "What do you think is causing the breathing problem?"      asthma 9. OTHER SYMPTOMS: "Do you have any other symptoms? (e.g., dizziness, runny nose, cough, chest pain, fever)     Some shortness of breath, intermittent tightness/ in chest, had sharp pain with deep breath, denied wheezing   10. PREGNANCY: "Is there any chance you are pregnant?" "When was your last menstrual period?"       LMP: about one month ago  11. TRAVEL: "Have you traveled out of  the country in the last month?" (e.g., travel history, exposures)       Denied travel or exposure within past 14 days.  Protocols used: BREATHING DIFFICULTY-A-AH

## 2018-07-18 NOTE — Telephone Encounter (Signed)
Attempted to contact [facility] via phone x 2 to discuss potential plan for pt.  Unable to reach [facility]; left vm re: symptoms, and of appt. Scheduled at 4:30; and of pt prepared for poss. Web Ex or Telephonic appt.

## 2018-07-18 NOTE — Telephone Encounter (Signed)
Pt is okay with this being a webex visit. Would you like for me to change the appt so you can do it over webex?

## 2018-07-18 NOTE — Telephone Encounter (Signed)
The prozac was refilled and I left message for pt to call back so we could further triage pt due to her needing an albuterol inhaler for trouble breathing. PEC may speak with pt to further triage.

## 2018-07-18 NOTE — Telephone Encounter (Signed)
Yes we can do a web ex visit .  Has she been screened for mor visits than just shortness of breath?

## 2018-07-18 NOTE — Telephone Encounter (Signed)
Copied from CRM [id number]. Topic: General - Other >> Jul 18, 2018  9:48 AM Leafy Ro wrote: Reason for CRM: pt left on voicemail on refill line she  needs a refill on prozac and also would like albuterol inhaler for her allergies and breathing problems. I put in clinical call for my triage nurse to call pt back, [facility] [place] [address]

## 2018-07-18 NOTE — Telephone Encounter (Signed)
She is having increased SOBr and cough.

## 2018-07-19 DIAGNOSIS — J45901 Unspecified asthma with (acute) exacerbation: Secondary | ICD-10-CM | POA: Insufficient documentation

## 2018-07-19 NOTE — Assessment & Plan Note (Signed)
ADVISED to resume fexofenadine 180 mg , continue singulair .

## 2018-07-19 NOTE — Assessment & Plan Note (Addendum)
Appears to have been triggered by environmental exposure AND seasonal allergies S  albuterol MDI,  prednisone taper. Doxycycline rx given to add if sputum becomes purulent or signs of sinus infection occurs

## 2018-07-19 NOTE — Progress Notes (Signed)
Telephone  Visit via Telephone Note  I connected with@ on @TODAY @ at  4:30 PM EDT by a  telemedicine application and verified that I am speaking with the correct person using two identifiers.  Location patient: home Location provider:work or home office Persons participating in the virtual visit: patient, provider  I discussed the limitations of evaluation and management by telemedicine and the availability of in person appointments. The patient expressed understanding and agreed to proceed.   HPI:  2 day history of chest tightness,  Cough productive of pale yellow sputum.  Started after spending several hours working outside on Friday doing lawn work.  Denies fevers, body aches,  Sore throat headache/sinus pressure .  Has not had to use albuterol  MDI in over a year . Had some pleurisy yesterday,  No wheezing or hemoptysis    ROS: See pertinent positives and negatives per HPI.  No past medical history on file.  No past surgical history on file.  No family history on file.  SOCIAL HX: married , no alcohol or tobacco use   Current Outpatient Medications:  .  FLUoxetine (PROZAC) 20 MG capsule, Take 2 capsules (40 mg total) by mouth daily., Disp: 180 capsule, Rfl: 1 .  montelukast (SINGULAIR) 10 MG tablet, Take 1 tablet (10 mg total) by mouth at bedtime. (Patient not taking: Reported on 12/06/2017), Disp: 30 tablet, Rfl: 3  EXAM:   GENERAL: alert, oriented,and in no acute distress NECK: normal movements of the head and neck LUNGS: no obvious gross SOB, gasping or wheezing PSYCH/NEURO: pleasant and cooperative, no obvious depression or anxiety, speech and thought processing grossly intact  ASSESSMENT AND PLAN:  Discussed the following assessment and plan:  No diagnosis found.     I discussed the assessment and treatment plan with the patient. The patient was provided an opportunity to ask questions and all were answered. The patient agreed with the plan and demonstrated an  understanding of the instructions.   The patient was advised to call back or seek an in-person evaluation if the symptoms worsen or if the condition fails to improve as anticipated.  I provided  20 minutes of non-face-to-face time during this encounter.   Sherlene Shams, MD

## 2018-08-14 ENCOUNTER — Other Ambulatory Visit: Payer: Self-pay | Admitting: Family

## 2018-08-14 DIAGNOSIS — J301 Allergic rhinitis due to pollen: Secondary | ICD-10-CM

## 2018-12-19 ENCOUNTER — Other Ambulatory Visit: Payer: Self-pay | Admitting: Internal Medicine

## 2018-12-19 DIAGNOSIS — J301 Allergic rhinitis due to pollen: Secondary | ICD-10-CM

## 2019-01-29 ENCOUNTER — Encounter: Payer: Self-pay | Admitting: Internal Medicine

## 2019-01-29 ENCOUNTER — Ambulatory Visit (INDEPENDENT_AMBULATORY_CARE_PROVIDER_SITE_OTHER): Payer: BC Managed Care – PPO | Admitting: Internal Medicine

## 2019-01-29 ENCOUNTER — Other Ambulatory Visit: Payer: Self-pay

## 2019-01-29 VITALS — Ht 69.5 in | Wt 153.4 lb

## 2019-01-29 DIAGNOSIS — R5383 Other fatigue: Secondary | ICD-10-CM

## 2019-01-29 DIAGNOSIS — J45909 Unspecified asthma, uncomplicated: Secondary | ICD-10-CM

## 2019-01-29 DIAGNOSIS — Z113 Encounter for screening for infections with a predominantly sexual mode of transmission: Secondary | ICD-10-CM | POA: Diagnosis not present

## 2019-01-29 DIAGNOSIS — F3341 Major depressive disorder, recurrent, in partial remission: Secondary | ICD-10-CM

## 2019-01-29 DIAGNOSIS — Z0000 Encounter for general adult medical examination without abnormal findings: Secondary | ICD-10-CM

## 2019-01-29 DIAGNOSIS — J4520 Mild intermittent asthma, uncomplicated: Secondary | ICD-10-CM | POA: Diagnosis not present

## 2019-01-29 DIAGNOSIS — J301 Allergic rhinitis due to pollen: Secondary | ICD-10-CM | POA: Diagnosis not present

## 2019-01-29 MED ORDER — MONTELUKAST SODIUM 10 MG PO TABS: 10.0000 mg | ORAL_TABLET | Freq: Every day | ORAL | 1 refills | Status: DC

## 2019-01-29 MED ORDER — FLUOXETINE HCL 20 MG PO CAPS: 40.0000 mg | ORAL_CAPSULE | Freq: Every day | ORAL | 1 refills | Status: DC

## 2019-01-29 NOTE — Assessment & Plan Note (Addendum)
Controlled with 40 mg prozac daily.  No changes today

## 2019-01-29 NOTE — Progress Notes (Signed)
Virtual Visit via Doxy[employer]me  This visit type was conducted due to national recommendations for restrictions regarding the COVID-19 pandemic (e.g. social distancing).  This format is felt to be most appropriate for this patient at this time.  All issues noted in this document were discussed and addressed.  No physical exam was performed (except for noted visual exam findings with Video Visits).   I connected with@ on 01/29/19 at  4:30 PM EDT by a video enabled telemedicine application  and verified that I am speaking with the correct person using two identifiers. Location patient: home Location provider: work or home office Persons participating in the virtual visit: patient, provider  I discussed the limitations, risks, security and privacy concerns of performing an evaluation and management service by telephone and the availability of in person appointments. I also discussed with the patient that there may be a patient responsible charge related to this service. The patient expressed understanding and agreed to proceed.  Reason for visit: medication refill  HPI: 43 yr old with asthma, intermittent,  MDD managed with Prozac.   Increased use of albuterol this summer due to humidity .  Still taking singulaire  Concerned that riding her horses in the cold weather will trigger her asthma.   Extreme weather and exercise are her triggers.  Has changed  Insurance not sure which  ICS/LABA  maintenance inhalers would be covered.   HM: last PAP 2016,  Last mammogram 2012 . Cites multiple reasons why she has not had a mor recent  imaging    Pertinent positives and negatives per HPI.  No past medical history on file.  No past surgical history on file.  No family history on file.  SOCIAL HX:  reports that she has never smoked. She has never used smokeless tobacco. She reports that she does not drink alcohol or use drugs.   Current Outpatient Medications:  .  albuterol (PROVENTIL HFA;VENTOLIN HFA)  108 (90 Base) MCG/ACT inhaler, Inhale 2 puffs into the lungs every 6 (six) hours as needed for wheezing or shortness of breath., Disp: 1 Inhaler, Rfl: 0 .  FLUoxetine (PROZAC) 20 MG capsule, Take 2 capsules (40 mg total) by mouth daily., Disp: 180 capsule, Rfl: 1 .  montelukast (SINGULAIR) 10 MG tablet, Take 1 tablet (10 mg total) by mouth at bedtime., Disp: 90 tablet, Rfl: 1  EXAM:  VITALS per patient if applicable:  GENERAL: alert, oriented, appears well and in no acute distress  HEENT: atraumatic, conjunttiva clear, no obvious abnormalities on inspection of external nose and ears  NECK: normal movements of the head and neck  LUNGS: on inspection no signs of respiratory distress, breathing rate appears normal, no obvious gross SOB, gasping or wheezing  CV: no obvious cyanosis  MS: moves all visible extremities without noticeable abnormality  PSYCH/NEURO: pleasant and cooperative, no obvious depression or anxiety, speech and thought processing grossly intact  ASSESSMENT AND PLAN:  Discussed the following assessment and plan:  Intrinsic asthma  Seasonal allergic rhinitis due to pollen - Plan: montelukast (SINGULAIR) 10 MG tablet  Mild intermittent asthma, unspecified whether complicated - Plan: CBC with Differential/Platelet  Screen for STD (sexually transmitted disease) - Plan: HIV Antibody (routine testing w rflx)  Encounter for preventive health examination - Plan: Lipid panel  Fatigue, unspecified type - Plan: Comprehensive metabolic panel, TSH  Recurrent major depressive disorder, in partial remission (HCC)  Depression Controlled with 40 mg prozac daily.  No changes today   Intrinsic asthma With occurrences in spring  and Fall .  Advised to start an ICS/LABA and continue Singulair    I discussed the assessment and treatment plan with the patient. The patient was provided an opportunity to ask questions and all were answered. The patient agreed with the plan and  demonstrated an understanding of the instructions.   The patient was advised to call back or seek an in-person evaluation if the symptoms worsen or if the condition fails to improve as anticipated.   I provided  25 minutes of non-face-to-face time during this encounter reviewing patient's current problems and post surgeries.  Providing counseling on the above mentioned problems , and coordination  of care .  Crecencio Mc, MD

## 2019-01-29 NOTE — Patient Instructions (Addendum)
I will order your mammogram  For a Mon Tues or Wed morning   PAP smear and fasting labs at your CPE  At 2:30 on Nov 17th.  Do not eat lunch  That day but ok to have breakfast it it is finished before 10:00  You should start a maintenance inhaler for the cold/flu season.  Check with your insurance about coverage of either MDI listed below   Symbicort Advair

## 2019-01-29 NOTE — Assessment & Plan Note (Signed)
With occurrences in spring and Fall .  Advised to start an ICS/LABA and continue Singulair

## 2019-02-05 ENCOUNTER — Telehealth: Payer: Self-pay

## 2019-02-05 DIAGNOSIS — J301 Allergic rhinitis due to pollen: Secondary | ICD-10-CM

## 2019-02-05 MED ORDER — MONTELUKAST SODIUM 10 MG PO TABS: 10.0000 mg | ORAL_TABLET | Freq: Every day | ORAL | 3 refills | Status: DC

## 2019-02-05 NOTE — Telephone Encounter (Signed)
Refilled medication

## 2019-03-13 ENCOUNTER — Other Ambulatory Visit: Payer: Self-pay

## 2019-03-13 ENCOUNTER — Ambulatory Visit (INDEPENDENT_AMBULATORY_CARE_PROVIDER_SITE_OTHER): Payer: BC Managed Care – PPO | Admitting: Internal Medicine

## 2019-03-13 ENCOUNTER — Other Ambulatory Visit (HOSPITAL_COMMUNITY)
Admission: RE | Admit: 2019-03-13 | Discharge: 2019-03-13 | Disposition: A | Discharge status: Home / Self Care | Payer: BC Managed Care – PPO | Source: Ambulatory Visit | Attending: Internal Medicine | Admitting: Internal Medicine

## 2019-03-13 ENCOUNTER — Encounter: Payer: Self-pay | Admitting: Internal Medicine

## 2019-03-13 VITALS — BP 114/70 | HR 61 | Temp 97.5°F | Resp 14 | Ht 69.5 in | Wt 164.4 lb

## 2019-03-13 DIAGNOSIS — J4520 Mild intermittent asthma, uncomplicated: Secondary | ICD-10-CM

## 2019-03-13 DIAGNOSIS — R5383 Other fatigue: Secondary | ICD-10-CM

## 2019-03-13 DIAGNOSIS — Z113 Encounter for screening for infections with a predominantly sexual mode of transmission: Secondary | ICD-10-CM

## 2019-03-13 DIAGNOSIS — Z124 Encounter for screening for malignant neoplasm of cervix: Secondary | ICD-10-CM | POA: Diagnosis not present

## 2019-03-13 DIAGNOSIS — Z23 Encounter for immunization: Secondary | ICD-10-CM | POA: Diagnosis not present

## 2019-03-13 DIAGNOSIS — Z0000 Encounter for general adult medical examination without abnormal findings: Secondary | ICD-10-CM

## 2019-03-13 DIAGNOSIS — L578 Other skin changes due to chronic exposure to nonionizing radiation: Secondary | ICD-10-CM

## 2019-03-13 DIAGNOSIS — Z1231 Encounter for screening mammogram for malignant neoplasm of breast: Secondary | ICD-10-CM

## 2019-03-13 DIAGNOSIS — J45909 Unspecified asthma, uncomplicated: Secondary | ICD-10-CM

## 2019-03-13 DIAGNOSIS — F3341 Major depressive disorder, recurrent, in partial remission: Secondary | ICD-10-CM

## 2019-03-13 NOTE — Patient Instructions (Signed)
Health Maintenance, Female Adopting a healthy lifestyle and getting preventive care are important in promoting health and wellness. Ask your health care provider about:  The right schedule for you to have regular tests and exams.  Things you can do on your own to prevent diseases and keep yourself healthy. What should I know about diet, weight, and exercise? Eat a healthy diet   Eat a diet that includes plenty of vegetables, fruits, low-fat dairy products, and lean protein.  Do not eat a lot of foods that are high in solid fats, added sugars, or sodium. Maintain a healthy weight Body mass index (BMI) is used to identify weight problems. It estimates body fat based on height and weight. Your health care provider can help determine your BMI and help you achieve or maintain a healthy weight. Get regular exercise Get regular exercise. This is one of the most important things you can do for your health. Most adults should:  Exercise for at least 150 minutes each week. The exercise should increase your heart rate and make you sweat (moderate-intensity exercise).  Do strengthening exercises at least twice a week. This is in addition to the moderate-intensity exercise.  Spend less time sitting. Even light physical activity can be beneficial. Watch cholesterol and blood lipids Have your blood tested for lipids and cholesterol at 45 years of age, then have this test every 5 years. Have your cholesterol levels checked more often if:  Your lipid or cholesterol levels are high.  You are older than 45 years of age.  You are at high risk for heart disease. What should I know about cancer screening? Depending on your health history and family history, you may need to have cancer screening at various ages. This may include screening for:  Breast cancer.  Cervical cancer.  Colorectal cancer.  Skin cancer.  Lung cancer. What should I know about heart disease, diabetes, and high blood  pressure? Blood pressure and heart disease  High blood pressure causes heart disease and increases the risk of stroke. This is more likely to develop in people who have high blood pressure readings, are of African descent, or are overweight.  Have your blood pressure checked: ? Every 3-5 years if you are 18-39 years of age. ? Every year if you are 40 years old or older. Diabetes Have regular diabetes screenings. This checks your fasting blood sugar level. Have the screening done:  Once every three years after age 40 if you are at a normal weight and have a low risk for diabetes.  More often and at a younger age if you are overweight or have a high risk for diabetes. What should I know about preventing infection? Hepatitis B If you have a higher risk for hepatitis B, you should be screened for this virus. Talk with your health care provider to find out if you are at risk for hepatitis B infection. Hepatitis C Testing is recommended for:  Everyone born from 1945 through 1965.  Anyone with known risk factors for hepatitis C. Sexually transmitted infections (STIs)  Get screened for STIs, including gonorrhea and chlamydia, if: ? You are sexually active and are younger than 45 years of age. ? You are older than 45 years of age and your health care provider tells you that you are at risk for this type of infection. ? Your sexual activity has changed since you were last screened, and you are at increased risk for chlamydia or gonorrhea. Ask your health care provider if   you are at risk.  Ask your health care provider about whether you are at high risk for HIV. Your health care provider may recommend a prescription medicine to help prevent HIV infection. If you choose to take medicine to prevent HIV, you should first get tested for HIV. You should then be tested every 3 months for as long as you are taking the medicine. Pregnancy  If you are about to stop having your period (premenopausal) and  you may become pregnant, seek counseling before you get pregnant.  Take 400 to 800 micrograms (mcg) of folic acid every day if you become pregnant.  Ask for birth control (contraception) if you want to prevent pregnancy. Osteoporosis and menopause Osteoporosis is a disease in which the bones lose minerals and strength with aging. This can result in bone fractures. If you are 65 years old or older, or if you are at risk for osteoporosis and fractures, ask your health care provider if you should:  Be screened for bone loss.  Take a calcium or vitamin D supplement to lower your risk of fractures.  Be given hormone replacement therapy (HRT) to treat symptoms of menopause. Follow these instructions at home: Lifestyle  Do not use any products that contain nicotine or tobacco, such as cigarettes, e-cigarettes, and chewing tobacco. If you need help quitting, ask your health care provider.  Do not use street drugs.  Do not share needles.  Ask your health care provider for help if you need support or information about quitting drugs. Alcohol use  Do not drink alcohol if: ? Your health care provider tells you not to drink. ? You are pregnant, may be pregnant, or are planning to become pregnant.  If you drink alcohol: ? Limit how much you use to 0-1 drink a day. ? Limit intake if you are breastfeeding.  Be aware of how much alcohol is in your drink. In the [place] one drink equals one 12 oz bottle of beer (355 mL), one 5 oz glass of wine (148 mL), or one 1 oz glass of hard liquor (44 mL). General instructions  Schedule regular health, dental, and eye exams.  Stay current with your vaccines.  Tell your health care provider if: ? You often feel depressed. ? You have ever been abused or do not feel safe at home. Summary  Adopting a healthy lifestyle and getting preventive care are important in promoting health and wellness.  Follow your health care provider's instructions about healthy  diet, exercising, and getting tested or screened for diseases.  Follow your health care provider's instructions on monitoring your cholesterol and blood pressure. This information is not intended to replace advice given to you by your health care provider. Make sure you discuss any questions you have with your health care provider. Document Released: 10/26/2010 Document Revised: 04/05/2018 Document Reviewed: 04/05/2018 Elsevier Patient Education  2020 [employer]  

## 2019-03-13 NOTE — Progress Notes (Signed)
Patient ID: Kendra Mckinney, female    DOB: 1976-04-14  Age: 43 y.o. MRN: [id number]  The patient is here for annual preventive  examination and management of other chronic and acute problems.  Mammograms has not had one in years ordered today  PAP DONE TODAY .   WANTS TO HAVE DERM [place] SKIN for skin changes to le  Periods irregular but light.   The risk factors are reflected in the social history.  The roster of all physicians providing medical care to patient - is listed in the Snapshot section of the chart.  Activities of daily living:  The patient is 100% independent in all ADLs: dressing, toileting, feeding as well as independent mobility  Home safety : The patient has smoke detectors in the home. They wear seatbelts.  There are no firearms at home. There is no violence in the home.   There is no risks for hepatitis, STDs or HIV. There is no   history of blood transfusion. They have no travel history to infectious disease endemic areas of the world.  The patient has seen their dentist in the last six month. They have seen their eye doctor in the last year. They admit to slight hearing difficulty with regard to whispered voices and some television programs.  They have deferred audiologic testing in the last year.  They do not  have excessive sun exposure. Discussed the need for sun protection: hats, long sleeves and use of sunscreen if there is significant sun exposure.   Diet: the importance of a healthy diet is discussed. They do have a healthier  diet.  The benefits of regular aerobic exercise were discussed. She is hiking or riding horses nearly daily and has given up sodas for the past 20 days    Depression screen: there are no signs or vegative symptoms of depression- irritability, change in appetite, anhedonia, sadness/tearfullness.  The following portions of the patient's history were reviewed and updated as appropriate: allergies, current medications, past family history, past  medical history,  past surgical history, past social history  and problem list.  Visual acuity was not assessed per patient preference since she has regular follow up with her ophthalmologist. Hearing and body mass index were assessed and reviewed.   During the course of the visit the patient was educated and counseled about appropriate screening and preventive services including : fall prevention , diabetes screening, nutrition counseling, colorectal cancer screening, and recommended immunizations.    CC: The primary encounter diagnosis was Encounter for screening mammogram for malignant neoplasm of breast. Diagnoses of Need for immunization against influenza, Cervical cancer screening, Fatigue, unspecified type, Encounter for preventive health examination, Mild intermittent asthma, unspecified whether complicated, Screen for STD (sexually transmitted disease), Need for 23-polyvalent pneumococcal polysaccharide vaccine, Sun-damaged skin, Recurrent major depressive disorder, in partial remission ([place]), and Intrinsic asthma were also pertinent to this visit.  1) asthma:  No exacerbations or use of inhaler in months.   History Lakeria has no past medical history on file.   She has no past surgical history on file.   Her family history is not on file.She reports that she has never smoked. She has never used smokeless tobacco. She reports that she does not drink alcohol or use drugs.  Outpatient Medications Prior to Visit  Medication Sig Dispense Refill  . albuterol (PROVENTIL HFA;VENTOLIN HFA) 108 (90 Base) MCG/ACT inhaler Inhale 2 puffs into the lungs every 6 (six) hours as needed for wheezing or shortness of breath. 1  Inhaler 0  . FLUoxetine (PROZAC) 20 MG capsule Take 2 capsules (40 mg total) by mouth daily. 180 capsule 1  . montelukast (SINGULAIR) 10 MG tablet Take 1 tablet (10 mg total) by mouth at bedtime. 90 tablet 3   No facility-administered medications prior to visit.     Review of  Systems   Patient denies headache, fevers, malaise, unintentional weight loss, skin rash, eye pain, sinus congestion and sinus pain, sore throat, dysphagia,  hemoptysis , cough, dyspnea, wheezing, chest pain, palpitations, orthopnea, edema, abdominal pain, nausea, melena, diarrhea, constipation, flank pain, dysuria, hematuria, urinary  Frequency, nocturia, numbness, tingling, seizures,  Focal weakness, Loss of consciousness,  Tremor, insomnia, depression, anxiety, and suicidal ideation.      Objective:  BP 114/70 (BP Location: Left Arm, Patient Position: Sitting, Cuff Size: Normal)   Pulse 61   Temp (!) 97.5 F (36.4 C) (Temporal)   Resp 14   Ht 5' 9.5" (1.765 m)   Wt 164 lb 6.4 oz (74.6 kg)   SpO2 98%   BMI 23.93 kg/m   Physical Exam   General Appearance:    Alert, cooperative, no distress, appears stated age  Head:    Normocephalic, without obvious abnormality, atraumatic  Eyes:    PERRL, conjunctiva/corneas clear, EOM's intact, fundi    benign, both eyes  Ears:    Normal TM's and external ear canals, both ears  Nose:   Nares normal, septum midline, mucosa normal, no drainage    or sinus tenderness  Throat:   Lips, mucosa, and tongue normal; teeth and gums normal  Neck:   Supple, symmetrical, trachea midline, no adenopathy;    thyroid:  no enlargement/tenderness/nodules; no carotid   bruit or JVD  Back:     Symmetric, no curvature, ROM normal, no CVA tenderness  Lungs:     Clear to auscultation bilaterally, respirations unlabored  Chest Wall:    No tenderness or deformity   Heart:    Regular rate and rhythm, S1 and S2 normal, no murmur, rub   or gallop  Breast Exam:    Saline implants.  No tenderness, masses, or nipple abnormality  Abdomen:     Soft, non-tender, bowel sounds active all four quadrants,    no masses, no organomegaly  Genitalia:    Pelvic: cervix normal in appearance, external genitalia normal, no adnexal masses or tenderness, no cervical motion tenderness,  rectovaginal septum normal, uterus normal size, shape, and consistency and vagina normal without discharge  Extremities:   Extremities normal, atraumatic, no cyanosis or edema  Pulses:   2+ and symmetric all extremities  Skin:   Skin color, texture, turgor normal, no rashes or lesions  Lymph nodes:   Cervical, supraclavicular, and axillary nodes normal  Neurologic:   CNII-XII intact, normal strength, sensation and reflexes    throughout     Assessment & Plan:   Problem List Items Addressed This Visit      Unprioritized   Depression    Controlled with 40 mg prozac daily.  No changes today       Encounter for preventive health examination    age appropriate education and counseling updated, referrals for preventative services and immunizations addressed, dietary and smoking counseling addressed, most recent labs reviewed.  I have personally reviewed and have noted:  1) the patient's medical and social history 2) The pt's use of alcohol, tobacco, and illicit drugs 3) The patient's current medications and supplements 4) Functional ability including ADL's, fall risk, home safety risk,  hearing and visual impairment 5) Diet and physical activities 6) Evidence for depression or mood disorder 7) The patient's height, weight, and BMI have been recorded in the chart  I have made referrals, and provided counseling and education based on review of the above      Intrinsic asthma    With occurrences much less frequent since wearing a mask in public usual occurrence in spring and Fall .  Not using   ICS/LABA; continue Singulair       Other Visit Diagnoses    Encounter for screening mammogram for malignant neoplasm of breast    -  Primary   Relevant Orders   MM 3D screening mammogram [facility]   Need for immunization against influenza       Relevant Orders   Flu Vaccine QUAD 36+ mos IM (Completed)   Cervical cancer screening       Relevant Orders   Cytology - PAP( [place])   Fatigue,  unspecified type       Mild intermittent asthma, unspecified whether complicated       Screen for STD (sexually transmitted disease)       Need for 23-polyvalent pneumococcal polysaccharide vaccine       Relevant Orders   Pneumococcal polysaccharide vaccine 23-valent greater than or equal to 2yo subcutaneous/IM (Completed)   Sun-damaged skin       Relevant Orders   Ambulatory referral to Dermatology      I am having Catheleen L. Grout maintain her albuterol, FLUoxetine, and montelukast.  No orders of the defined types were placed in this encounter.   There are no discontinued medications.  Follow-up: No follow-ups on file.   Sherlene Shams, MD

## 2019-03-14 LAB — COMPREHENSIVE METABOLIC PANEL
ALT: 21 U/L (ref 0–35)
AST: 27 U/L (ref 0–37)
Albumin: 4.6 g/dL (ref 3.5–5.2)
Alkaline Phosphatase: 63 U/L (ref 39–117)
BUN: 18 mg/dL (ref 6–23)
CO2: 29 mEq/L (ref 19–32)
Calcium: 9.6 mg/dL (ref 8.4–10.5)
Chloride: 102 mEq/L (ref 96–112)
Creatinine, Ser: 0.71 mg/dL (ref 0.40–1.20)
GFR: 88.77 mL/min (ref >60.00)
Glucose, Bld: 77 mg/dL (ref 70–99)
Potassium: 4.8 mEq/L (ref 3.5–5.1)
Sodium: 139 mEq/L (ref 135–145)
Total Bilirubin: 0.3 mg/dL (ref 0.2–1.2)
Total Protein: 7.5 g/dL (ref 6.0–8.3)

## 2019-03-14 LAB — LIPID PANEL
Cholesterol: 225 mg/dL — ABNORMAL HIGH (ref 0–200)
HDL: 46.3 mg/dL (ref >39.00)
LDL Cholesterol: 154 mg/dL — ABNORMAL HIGH (ref 0–99)
NonHDL: 178.62
Total CHOL/HDL Ratio: 5
Triglycerides: 121 mg/dL (ref 0.0–149.0)
VLDL: 24.2 mg/dL (ref 0.0–40.0)

## 2019-03-14 LAB — CBC WITH DIFFERENTIAL/PLATELET
Basophils Absolute: 0 10*3/uL (ref 0.0–0.1)
Basophils Relative: 0.8 % (ref 0.0–3.0)
Eosinophils Absolute: 0.2 10*3/uL (ref 0.0–0.7)
Eosinophils Relative: 5.1 % — ABNORMAL HIGH (ref 0.0–5.0)
HCT: 37.1 % (ref 36.0–46.0)
Hemoglobin: 12.3 g/dL (ref 12.0–15.0)
Lymphocytes Relative: 52.1 % — ABNORMAL HIGH (ref 12.0–46.0)
Lymphs Abs: 2.1 10*3/uL (ref 0.7–4.0)
MCHC: 33.2 g/dL (ref 30.0–36.0)
MCV: 91.8 fl (ref 78.0–100.0)
Monocytes Absolute: 0.5 10*3/uL (ref 0.1–1.0)
Monocytes Relative: 11.9 % (ref 3.0–12.0)
Neutro Abs: 1.2 10*3/uL — ABNORMAL LOW (ref 1.4–7.7)
Neutrophils Relative %: 30.1 % — ABNORMAL LOW (ref 43.0–77.0)
Platelets: 330 10*3/uL (ref 150.0–400.0)
RBC: 4.04 Mil/uL (ref 3.87–5.11)
RDW: 14.1 % (ref 11.5–15.5)
WBC: 4 10*3/uL (ref 4.0–10.5)

## 2019-03-14 LAB — TSH: TSH: 2.49 u[IU]/mL (ref 0.35–4.50)

## 2019-03-14 NOTE — Assessment & Plan Note (Signed)

## 2019-03-14 NOTE — Assessment & Plan Note (Signed)
Controlled with 40 mg prozac daily.  No changes today  

## 2019-03-14 NOTE — Assessment & Plan Note (Addendum)
With occurrences much less frequent since wearing a mask in public usual occurrence in spring and Fall .  Not using   ICS/LABA; continue Singulair

## 2019-03-16 ENCOUNTER — Telehealth: Payer: Self-pay | Admitting: Internal Medicine

## 2019-03-16 ENCOUNTER — Other Ambulatory Visit: Payer: Self-pay | Admitting: Internal Medicine

## 2019-03-16 DIAGNOSIS — Z114 Encounter for screening for human immunodeficiency virus [HIV]: Secondary | ICD-10-CM

## 2019-03-16 LAB — CYTOLOGY - PAP
Comment: NEGATIVE
Diagnosis: NEGATIVE
High risk HPV: NEGATIVE

## 2019-03-16 NOTE — Telephone Encounter (Signed)
SHE NEEDS AN ADDITIONAL TEST RUND BECAUSE HER HIV RESULT WAS AMBIGOUOUS.  I HAVE ORDERED THE TEST

## 2019-03-19 NOTE — Telephone Encounter (Signed)
LMTCB. Need to schedule pt for a nonfasting lab appt.  

## 2019-03-22 LAB — HIV-1/2 AB - DIFFERENTIATION
HIV-1 antibody: NEGATIVE
HIV-2 Ab: NEGATIVE

## 2019-03-22 LAB — HIV ANTIBODY (ROUTINE TESTING W REFLEX): HIV 1&2 Ab, 4th Generation: REACTIVE — AB

## 2019-03-22 LAB — HIV-1 RNA, QUALITATIVE, TMA: HIV-1 RNA, Qualitative, TMA: NOT DETECTED

## 2019-03-29 NOTE — Telephone Encounter (Signed)
LMCTB

## 2019-04-05 NOTE — Telephone Encounter (Signed)
Maychart message was sent to pt and lab work has came back.

## 2019-05-30 ENCOUNTER — Ambulatory Visit
Admission: EM | Admit: 2019-05-30 | Discharge: 2019-05-30 | Disposition: A | Discharge status: Home / Self Care | Payer: BC Managed Care – PPO | Attending: Emergency Medicine | Admitting: Emergency Medicine

## 2019-05-30 ENCOUNTER — Ambulatory Visit
Admission: RE | Admit: 2019-05-30 | Discharge: 2019-05-30 | Disposition: A | Discharge status: Home / Self Care | Payer: BC Managed Care – PPO | Source: Ambulatory Visit | Attending: Emergency Medicine | Admitting: Emergency Medicine

## 2019-05-30 ENCOUNTER — Other Ambulatory Visit: Payer: Self-pay

## 2019-05-30 ENCOUNTER — Ambulatory Visit
Admission: RE | Admit: 2019-05-30 | Discharge: 2019-05-30 | Disposition: A | Discharge status: Home / Self Care | Payer: BC Managed Care – PPO | Attending: Emergency Medicine | Admitting: Emergency Medicine

## 2019-05-30 DIAGNOSIS — S6991XA Unspecified injury of right wrist, hand and finger(s), initial encounter: Secondary | ICD-10-CM | POA: Insufficient documentation

## 2019-05-30 DIAGNOSIS — W541XXA Struck by dog, initial encounter: Secondary | ICD-10-CM | POA: Diagnosis not present

## 2019-05-30 DIAGNOSIS — X58XXXA Exposure to other specified factors, initial encounter: Secondary | ICD-10-CM | POA: Diagnosis not present

## 2019-05-30 MED ORDER — IBUPROFEN 800 MG PO TABS: 800.0000 mg | ORAL_TABLET | Freq: Three times a day (TID) | ORAL | 0 refills | Status: DC | PRN

## 2019-05-30 NOTE — Discharge Instructions (Addendum)
Go to [facility] now for the xray of your finger.  I will call you with the results this afternoon.    Take the ibuprofen as needed for pain.    Follow up with [facility] as directed.

## 2019-05-30 NOTE — ED Triage Notes (Signed)
Patient states that she was breaking up a fight between her 2 dogs. Right pinky finger was injured and has deformity. Patient reports that area has had some numbness. This incident occurred this morning.

## 2019-05-30 NOTE — ED Provider Notes (Signed)
[facility] [phone] Arrival date & time: 05/30/19  1427      History   Chief Complaint Chief Complaint  Patient presents with  . Finger Injury    right pinky finger    HPI Kendra Mckinney is a 44 y.o. female.  Patient presents with injury to her right fifth finger; she is unable to actively move her DIP joint but can passively move it without pain.  She states the injury occurred when she was breaking up a fight between her dogs this morning.  She states her pain level is 1/10; she denies numbness, paresthesias, or weakness in her finger, other than she is unable to actively move her DIP joint.      The history is provided by the patient.    History reviewed. No pertinent past medical history.  Patient Active Problem List   Diagnosis Date Noted  . Asthma exacerbation 07/19/2018  . Irregular menses 12/07/2017  . Allergic rhinitis 10/13/2016  . Vaginal polyp 09/25/2014  . 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 Depression 08/29/2011  . Encounter for preventive health examination 08/29/2011    History reviewed. No pertinent surgical history.  OB History   No obstetric history on file.      Home Medications    Prior to Admission medications   Medication Sig Start Date End Date Taking? Authorizing Provider  albuterol (PROVENTIL HFA;VENTOLIN HFA) 108 (90 Base) MCG/ACT inhaler Inhale 2 puffs into the lungs every 6 (six) hours as needed for wheezing or shortness of breath. 07/18/18  Yes Sherlene Shams, MD  FLUoxetine (PROZAC) 20 MG capsule Take 2 capsules (40 mg total) by mouth daily. 01/29/19  Yes Sherlene Shams, MD  montelukast (SINGULAIR) 10 MG tablet Take 1 tablet (10 mg total) by mouth at bedtime. 02/05/19  Yes Sherlene Shams, MD  ibuprofen (ADVIL) 800 MG tablet Take 1 tablet (800 mg total) by mouth every 8 (eight) hours as needed. 05/30/19   Mickie Bail, NP    Family History Family History  Problem Relation Age of Onset  . Hypertension Mother   . Healthy Father      Social History Social History   Tobacco Use  . Smoking status: Never Smoker  . Smokeless tobacco: Never Used  Substance Use Topics  . Alcohol use: No  . Drug use: No     Allergies   Patient has no known allergies.   Review of Systems Review of Systems  Constitutional: Negative for chills and fever.  HENT: Negative for ear pain and sore throat.   Eyes: Negative for pain and visual disturbance.  Respiratory: Negative for cough and shortness of breath.   Cardiovascular: Negative for chest pain and palpitations.  Gastrointestinal: Negative for abdominal pain and vomiting.  Genitourinary: Negative for dysuria and hematuria.  Musculoskeletal: Positive for arthralgias. Negative for back pain.  Skin: Negative for color change, rash and wound.  Neurological: Negative for seizures, syncope and numbness.  All other systems reviewed and are negative.    Physical Exam Triage Vital Signs ED Triage Vitals  Enc Vitals Group     BP      Pulse      Resp      Temp      Temp src      SpO2      Weight      Height      Head Circumference      Peak Flow      Pain Score  Pain Loc      Pain Edu?      Excl. in [place]?    No data found.  Updated Vital Signs BP 120/73 (BP Location: Left Arm)   Pulse 93   Temp 98.8 F (37.1 C) (Oral)   Resp 16   Ht 5\' 10"  (1.778 m)   Wt 145 lb (65.8 kg)   LMP 05/09/2019   SpO2 98%   BMI 20.81 kg/m   Visual Acuity Right Eye Distance:   Left Eye Distance:   Bilateral Distance:    Right Eye Near:   Left Eye Near:    Bilateral Near:     Physical Exam Vitals and nursing note reviewed.  Constitutional:      General: She is not in acute distress.    Appearance: She is well-developed.  HENT:     Head: Normocephalic and atraumatic.     Mouth/Throat:     Mouth: Mucous membranes are moist.     Pharynx: Oropharynx is clear.  Eyes:     Conjunctiva/sclera: Conjunctivae normal.  Cardiovascular:     Rate and Rhythm: Normal rate and  regular rhythm.     Heart sounds: No murmur.  Pulmonary:     Effort: Pulmonary effort is normal. No respiratory distress.     Breath sounds: Normal breath sounds.  Abdominal:     General: Bowel sounds are normal.     Palpations: Abdomen is soft.     Tenderness: There is no abdominal tenderness. There is no guarding or rebound.  Musculoskeletal:        General: Deformity present.     Cervical back: Neck supple.     Comments: Full passive ROM of right 5th finger DIP joint without pain but no active movement.  Sensation intact.  Brisk cap refill.  No open wounds.    Skin:    General: Skin is warm and dry.     Capillary Refill: Capillary refill takes less than 2 seconds.     Findings: No lesion.  Neurological:     Mental Status: She is alert and oriented to person, place, and time.     Sensory: No sensory deficit.  Psychiatric:        Mood and Affect: Mood normal.        Behavior: Behavior normal.      UC Treatments / Results  Labs (all labs ordered are listed, but only abnormal results are displayed) Labs Reviewed - No data to display  EKG   Radiology No results found.  Procedures Procedures (including critical care time)  Medications Ordered in UC Medications - No data to display  Initial Impression / Assessment and Plan / UC Course  I have reviewed the triage vital signs and the nursing notes.  Pertinent labs & imaging results that were available during my care of the patient were reviewed by me and considered in my medical decision making (see chart for details).   Right 5th finger injury.  Xray negative for fracture or dislocation.  Treating with finger splint and ibuprofen as needed for discomfort.  Instructed patient to follow-up with [facility] if her symptoms are not improving.  Patient agrees to plan of care.     Final Clinical Impressions(s) / UC Diagnoses   Final diagnoses:  Finger injury, right, initial encounter     Discharge Instructions      Go to [facility] now for the xray of your finger.  I will call you with the  results this afternoon.    Take the ibuprofen as needed for pain.    Follow up with [facility] as directed.        ED Prescriptions    Medication Sig Dispense Auth. Provider   ibuprofen (ADVIL) 800 MG tablet Take 1 tablet (800 mg total) by mouth every 8 (eight) hours as needed. 21 tablet Mickie Bail, NP     PDMP not reviewed this encounter.   Mickie Bail, NP 05/30/19 [phone]

## 2019-05-31 DIAGNOSIS — M20011 Mallet finger of right finger(s): Secondary | ICD-10-CM | POA: Diagnosis not present

## 2019-06-21 DIAGNOSIS — M20011 Mallet finger of right finger(s): Secondary | ICD-10-CM | POA: Diagnosis not present

## 2019-07-16 DIAGNOSIS — M20011 Mallet finger of right finger(s): Secondary | ICD-10-CM | POA: Diagnosis not present

## 2019-08-24 ENCOUNTER — Other Ambulatory Visit: Payer: Self-pay | Admitting: Internal Medicine

## 2019-12-21 ENCOUNTER — Other Ambulatory Visit: Payer: Self-pay | Admitting: Internal Medicine

## 2020-05-02 ENCOUNTER — Other Ambulatory Visit: Payer: Self-pay | Admitting: Internal Medicine

## 2020-05-15 ENCOUNTER — Ambulatory Visit: Payer: BC Managed Care – PPO | Admitting: Internal Medicine

## 2020-05-19 ENCOUNTER — Ambulatory Visit: Payer: BC Managed Care – PPO | Admitting: Internal Medicine

## 2020-05-19 ENCOUNTER — Encounter: Payer: Self-pay | Admitting: Internal Medicine

## 2020-05-19 ENCOUNTER — Telehealth: Payer: Self-pay

## 2020-05-19 ENCOUNTER — Other Ambulatory Visit: Payer: Self-pay

## 2020-05-19 VITALS — BP 118/70 | HR 85 | Temp 98.0°F | Resp 16 | Wt 170.0 lb

## 2020-05-19 DIAGNOSIS — L309 Dermatitis, unspecified: Secondary | ICD-10-CM

## 2020-05-19 MED ORDER — TRIAMCINOLONE ACETONIDE 0.1 % EX CREA: 1.0000 "application " | TOPICAL_CREAM | Freq: Two times a day (BID) | CUTANEOUS | 0 refills | Status: DC

## 2020-05-19 NOTE — Assessment & Plan Note (Signed)
Suggested by history or pruritus of right nipple in a patient with saline implants (old) .  Exam done today notes only an asymmetry to breast with regard to size (left greater than right )  . Wonder if right breast implant is leaking and itching is a reaction .  ddx of nipple dermatitis includes eczema and Paget's disease of breast.   Trial of triamcinolone ointment bid x 14 days,  And diagnostic mammogram ordered given higher incidence of breast ca in patients with Pagets

## 2020-05-19 NOTE — Patient Instructions (Signed)
I AM prescribing triamcinolone ointment to use twice daily for 10-14 days   Diagnostic mammogram ordered.  IF [facility] is not in network, we will use [facility]

## 2020-05-19 NOTE — Progress Notes (Signed)
Subjective:  Patient ID: Kendra Mckinney, female    DOB: 03/03/1976  Age: 45 y.o. MRN: [id number]  CC: The encounter diagnosis was Nipple dermatitis.  HP Kendra Mckinney presents for evaluation and treatment of right  nipple itching  This visit occurred during the SARS-CoV-2 public health emergency.  Safety protocols were in place, including screening questions prior to the visit, additional usage of staff PPE, and extensive cleaning of exam room while observing appropriate contact time as indicated for disinfecting solutions.   Duration : one month,  Nipple became Intensely itch y for 2-3 weeks  Then resolved,  Skin of nipple Was flaky but sis not bleed, peal or ooze.  Has not  Changed detergents, soaps or moisturizers,  Has changed bras,  No irritating clothes.  Has saline implants that were added over 10 years ago   FH of BRCA : none.   Outpatient Medications Prior to Visit  Medication Sig Dispense Refill  . albuterol (PROVENTIL HFA;VENTOLIN HFA) 108 (90 Base) MCG/ACT inhaler Inhale 2 puffs into the lungs every 6 (six) hours as needed for wheezing or shortness of breath. 1 Inhaler 0  . FLUoxetine (PROZAC) 20 MG capsule Take 2 capsules by mouth once daily 180 capsule 0  . ibuprofen (ADVIL) 800 MG tablet Take 1 tablet (800 mg total) by mouth every 8 (eight) hours as needed. 21 tablet 0  . montelukast (SINGULAIR) 10 MG tablet Take 1 tablet (10 mg total) by mouth at bedtime. 90 tablet 3   No facility-administered medications prior to visit.    Review of Systems;  Patient denies headache, fevers, malaise, unintentional weight loss, skin rash, eye pain, sinus congestion and sinus pain, sore throat, dysphagia,  hemoptysis , cough, dyspnea, wheezing, chest pain, palpitations, orthopnea, edema, abdominal pain, nausea, melena, diarrhea, constipation, flank pain, dysuria, hematuria, urinary  Frequency, nocturia, numbness, tingling, seizures,  Focal weakness, Loss of consciousness,  Tremor, insomnia,  depression, anxiety, and suicidal ideation.      Objective:  BP 118/70   Pulse 85   Temp 98 F (36.7 C) (Oral)   Resp 16   Wt 170 lb (77.1 kg)   SpO2 98%   BMI 24.39 kg/m   BP Readings from Last 3 Encounters:  05/19/20 118/70  05/30/19 120/73  03/13/19 114/70    Wt Readings from Last 3 Encounters:  05/19/20 170 lb (77.1 kg)  05/30/19 145 lb (65.8 kg)  03/13/19 164 lb 6.4 oz (74.6 kg)    General appearance: alert, cooperative and appears stated age Ears: normal TM's and external ear canals both ears Throat: lips, mucosa, and tongue normal; teeth and gums normal Neck: no adenopathy, no carotid bruit, supple, symmetrical, trachea midline and thyroid not enlarged, symmetric, no tenderness/mass/nodules Breast:  Left breast larger than right.  No masses,  Discoloration or obvious change to nipple  Back: symmetric, no curvature. ROM normal. No CVA tenderness. Lungs: clear to auscultation bilaterally Heart: regular rate and rhythm, S1, S2 normal, no murmur, click, rub or gallop Abdomen: soft, non-tender; bowel sounds normal; no masses,  no organomegaly Pulses: 2+ and symmetric Skin: Skin color, texture, turgor normal. No rashes or lesions Lymph nodes: Cervical, supraclavicular, and axillary nodes normal.  No results found for: HGBA1C  Lab Results  Component Value Date   CREATININE 0.71 03/13/2019   CREATININE 0.68 06/15/2016   CREATININE 0.72 10/04/2014    Lab Results  Component Value Date   WBC 4.0 03/13/2019   HGB 12.3 03/13/2019   HCT 37.1  03/13/2019   PLT 330.0 03/13/2019   GLUCOSE 77 03/13/2019   CHOL 225 (H) 03/13/2019   TRIG 121.0 03/13/2019   HDL 46.30 03/13/2019   LDLDIRECT 148.0 06/15/2016   LDLCALC 154 (H) 03/13/2019   ALT 21 03/13/2019   AST 27 03/13/2019   NA 139 03/13/2019   K 4.8 03/13/2019   CL 102 03/13/2019   CREATININE 0.71 03/13/2019   BUN 18 03/13/2019   CO2 29 03/13/2019   TSH 2.49 03/13/2019    DG Finger Little Right  Result  Date: 05/30/2019 CLINICAL DATA:  Trauma to small finger which got caught in a dog caller this morning. Numbness distally, difficulty straightening the finger. EXAM: RIGHT LITTLE FINGER 2+V COMPARISON:  None. FINDINGS: The distal interphalangeal joint is flexed approximately 47 degrees. No appreciable fracture or foreign body. No dislocation is observed. IMPRESSION: 1. No visible fracture or avulsion. The distal interphalangeal joint is flexed 47 degrees despite extension of the proximal interphalangeal joint. Electronically Signed   By: Van Clines M.D.   On: 05/30/2019 15:32    Assessment & Plan:   Problem List Items Addressed This Visit      Unprioritized   Nipple dermatitis - Primary    Suggested by history or pruritus of right nipple in a patient with saline implants (old) .  Exam done today notes only an asymmetry to breast with regard to size (left greater than right )  . Wonder if right breast implant is leaking and itching is a reaction .  ddx of nipple dermatitis includes eczema and Paget's disease of breast.   Trial of triamcinolone ointment bid x 14 days,  And diagnostic mammogram ordered given higher incidence of breast ca in patients with Pagets       Relevant Orders   MM Digital Diagnostic Bilat   US BREAST LTD UNI RIGHT INC AXILLA      I spent 30 mintutes dedicated to the care of this patient on the date of this encounter to include pre-visit review of his medical history,  Face-to-face time with the patient , and post visit ordering of testing and therapeutics.  I am having Kendra Mckinney start on triamcinolone. I am also having her maintain her albuterol, montelukast, ibuprofen, and FLUoxetine.  Meds ordered this encounter  Medications  . triamcinolone (KENALOG) 0.1 %    Sig: Apply 1 application topically 2 (two) times daily. To affected area x 10 days    Dispense:  30 g    Refill:  0    There are no discontinued medications.  Follow-up: No follow-ups on  file.   Crecencio Mc, MD

## 2020-05-19 NOTE — Telephone Encounter (Signed)
Order has been corrected.  

## 2020-05-19 NOTE — Telephone Encounter (Signed)
-----   Message from Karlyne Greenspan sent at 05/19/2020 12:19 PM EST ----- Regarding: Neeed new order Good afternoon!  Please need new order for Diag mammo [phone]. Thank you!

## 2020-05-20 ENCOUNTER — Telehealth: Payer: Self-pay | Admitting: Internal Medicine

## 2020-05-20 NOTE — Telephone Encounter (Signed)
lft pt vm to call ofc to regarding mammo at [facility]. Thank you!

## 2020-05-20 NOTE — Telephone Encounter (Signed)
Ok, Thank you! 

## 2020-05-21 ENCOUNTER — Encounter: Payer: Self-pay | Admitting: *Deleted

## 2020-06-11 ENCOUNTER — Ambulatory Visit: Payer: BC Managed Care – PPO | Admitting: Internal Medicine

## 2020-07-01 DIAGNOSIS — N6001 Solitary cyst of right breast: Secondary | ICD-10-CM | POA: Diagnosis not present

## 2020-07-01 DIAGNOSIS — L309 Dermatitis, unspecified: Secondary | ICD-10-CM | POA: Diagnosis not present

## 2020-07-01 DIAGNOSIS — N6002 Solitary cyst of left breast: Secondary | ICD-10-CM | POA: Diagnosis not present

## 2020-07-01 DIAGNOSIS — R928 Other abnormal and inconclusive findings on diagnostic imaging of breast: Secondary | ICD-10-CM | POA: Diagnosis not present

## 2020-07-01 DIAGNOSIS — R922 Inconclusive mammogram: Secondary | ICD-10-CM | POA: Diagnosis not present

## 2020-07-01 DIAGNOSIS — Z9882 Breast implant status: Secondary | ICD-10-CM | POA: Diagnosis not present

## 2020-07-01 LAB — HM MAMMOGRAPHY

## 2020-07-23 DIAGNOSIS — X789XXA Intentional self-harm by unspecified sharp object, initial encounter: Secondary | ICD-10-CM | POA: Diagnosis not present

## 2020-07-23 DIAGNOSIS — F332 Major depressive disorder, recurrent severe without psychotic features: Secondary | ICD-10-CM | POA: Diagnosis not present

## 2020-07-23 DIAGNOSIS — R45851 Suicidal ideations: Secondary | ICD-10-CM | POA: Diagnosis not present

## 2020-07-23 DIAGNOSIS — S51812A Laceration without foreign body of left forearm, initial encounter: Secondary | ICD-10-CM | POA: Diagnosis not present

## 2020-07-23 DIAGNOSIS — Z20822 Contact with and (suspected) exposure to covid-19: Secondary | ICD-10-CM | POA: Diagnosis not present

## 2020-07-23 DIAGNOSIS — S59912A Unspecified injury of left forearm, initial encounter: Secondary | ICD-10-CM | POA: Diagnosis not present

## 2020-07-23 DIAGNOSIS — Z23 Encounter for immunization: Secondary | ICD-10-CM | POA: Diagnosis not present

## 2020-07-23 DIAGNOSIS — F322 Major depressive disorder, single episode, severe without psychotic features: Secondary | ICD-10-CM | POA: Diagnosis not present

## 2020-07-24 DIAGNOSIS — X788XXA Intentional self-harm by other sharp object, initial encounter: Secondary | ICD-10-CM | POA: Diagnosis not present

## 2020-07-24 DIAGNOSIS — Z9152 Personal history of nonsuicidal self-harm: Secondary | ICD-10-CM | POA: Diagnosis not present

## 2020-07-24 DIAGNOSIS — G4700 Insomnia, unspecified: Secondary | ICD-10-CM | POA: Diagnosis not present

## 2020-07-24 DIAGNOSIS — F332 Major depressive disorder, recurrent severe without psychotic features: Secondary | ICD-10-CM | POA: Diagnosis not present

## 2020-07-24 DIAGNOSIS — Z818 Family history of other mental and behavioral disorders: Secondary | ICD-10-CM | POA: Diagnosis not present

## 2020-08-18 ENCOUNTER — Other Ambulatory Visit: Payer: Self-pay | Admitting: Internal Medicine

## 2020-12-15 ENCOUNTER — Other Ambulatory Visit: Payer: Self-pay | Admitting: Internal Medicine

## 2021-01-12 DIAGNOSIS — Z79899 Other long term (current) drug therapy: Secondary | ICD-10-CM | POA: Diagnosis not present

## 2021-01-12 DIAGNOSIS — Y289XXA Contact with unspecified sharp object, undetermined intent, initial encounter: Secondary | ICD-10-CM | POA: Diagnosis not present

## 2021-01-12 DIAGNOSIS — S71111A Laceration without foreign body, right thigh, initial encounter: Secondary | ICD-10-CM | POA: Diagnosis not present

## 2021-01-12 DIAGNOSIS — Z9151 Personal history of suicidal behavior: Secondary | ICD-10-CM | POA: Diagnosis not present

## 2021-01-12 DIAGNOSIS — F5001 Anorexia nervosa, restricting type: Secondary | ICD-10-CM | POA: Diagnosis not present

## 2021-01-12 DIAGNOSIS — F332 Major depressive disorder, recurrent severe without psychotic features: Secondary | ICD-10-CM | POA: Diagnosis not present

## 2021-01-12 DIAGNOSIS — R45851 Suicidal ideations: Secondary | ICD-10-CM | POA: Diagnosis not present

## 2021-01-12 DIAGNOSIS — Z20822 Contact with and (suspected) exposure to covid-19: Secondary | ICD-10-CM | POA: Diagnosis not present

## 2021-01-13 DIAGNOSIS — F332 Major depressive disorder, recurrent severe without psychotic features: Secondary | ICD-10-CM | POA: Diagnosis not present

## 2021-01-13 DIAGNOSIS — R632 Polyphagia: Secondary | ICD-10-CM | POA: Diagnosis not present

## 2021-01-13 DIAGNOSIS — F5001 Anorexia nervosa, restricting type: Secondary | ICD-10-CM | POA: Diagnosis not present

## 2021-01-14 DIAGNOSIS — F332 Major depressive disorder, recurrent severe without psychotic features: Secondary | ICD-10-CM | POA: Diagnosis not present

## 2021-01-15 DIAGNOSIS — F332 Major depressive disorder, recurrent severe without psychotic features: Secondary | ICD-10-CM | POA: Diagnosis not present

## 2021-01-16 DIAGNOSIS — F332 Major depressive disorder, recurrent severe without psychotic features: Secondary | ICD-10-CM | POA: Diagnosis not present

## 2021-01-17 DIAGNOSIS — F332 Major depressive disorder, recurrent severe without psychotic features: Secondary | ICD-10-CM | POA: Diagnosis not present

## 2021-01-18 DIAGNOSIS — F332 Major depressive disorder, recurrent severe without psychotic features: Secondary | ICD-10-CM | POA: Diagnosis not present

## 2021-01-19 DIAGNOSIS — F332 Major depressive disorder, recurrent severe without psychotic features: Secondary | ICD-10-CM | POA: Diagnosis not present

## 2021-01-20 DIAGNOSIS — F332 Major depressive disorder, recurrent severe without psychotic features: Secondary | ICD-10-CM | POA: Diagnosis not present

## 2021-05-11 ENCOUNTER — Other Ambulatory Visit: Payer: Self-pay | Admitting: Internal Medicine

## 2021-09-07 ENCOUNTER — Other Ambulatory Visit: Payer: Self-pay | Admitting: Family

## 2021-09-09 ENCOUNTER — Other Ambulatory Visit: Payer: Self-pay | Admitting: Family

## 2021-10-23 ENCOUNTER — Telehealth: Payer: Self-pay | Admitting: Internal Medicine

## 2021-10-23 MED ORDER — FLUOXETINE HCL 20 MG PO CAPS: 40.0000 mg | ORAL_CAPSULE | Freq: Every day | ORAL | 0 refills | Status: DC

## 2021-10-23 NOTE — Telephone Encounter (Signed)
Patient called and scheduled first available appointment for 11/02/2021 medication refill. She is out of medication and wanted to know if she could get her medication refilled; FLUoxetine (PROZAC) 20 MG capsule

## 2021-10-23 NOTE — Telephone Encounter (Signed)
Medication has been refilled for 30 days only.

## 2021-11-02 ENCOUNTER — Encounter: Payer: Self-pay | Admitting: Internal Medicine

## 2021-11-02 ENCOUNTER — Ambulatory Visit (INDEPENDENT_AMBULATORY_CARE_PROVIDER_SITE_OTHER): Payer: Self-pay | Admitting: Internal Medicine

## 2021-11-02 ENCOUNTER — Ambulatory Visit: Payer: Self-pay | Admitting: Internal Medicine

## 2021-11-02 DIAGNOSIS — F418 Other specified anxiety disorders: Secondary | ICD-10-CM

## 2021-11-02 DIAGNOSIS — Z1211 Encounter for screening for malignant neoplasm of colon: Secondary | ICD-10-CM

## 2021-11-02 MED ORDER — ALPRAZOLAM 0.25 MG PO TABS: 0.2500 mg | ORAL_TABLET | Freq: Two times a day (BID) | ORAL | 0 refills | Status: DC | PRN

## 2021-11-02 MED ORDER — BUSPIRONE HCL 10 MG PO TABS: 10.0000 mg | ORAL_TABLET | Freq: Two times a day (BID) | ORAL | 2 refills | Status: DC

## 2021-11-02 MED ORDER — FLUOXETINE HCL 20 MG PO CAPS: 40.0000 mg | ORAL_CAPSULE | Freq: Every day | ORAL | 1 refills | Status: DC

## 2021-11-02 MED ORDER — ALBUTEROL SULFATE HFA 108 (90 BASE) MCG/ACT IN AERS: 2.0000 | INHALATION_SPRAY | Freq: Four times a day (QID) | RESPIRATORY_TRACT | 0 refills | Status: AC | PRN

## 2021-11-02 NOTE — Progress Notes (Unsigned)
Telephone   Note    This format is felt to be most appropriate for this patient at this time.  All issues noted in this document were discussed and addressed.  No physical exam was performed (except for noted visual exam findings with Video Visits).   I connected withNAME on 11/02/21 at  4:30 PM EDT by telephone and verified that I am speaking with the correct person using two identifiers. Location patient: home Location provider: work or home office Persons participating in the virtual visit: patient, provider  I discussed the limitations, risks, security and privacy concerns of performing an evaluation and management service by telephone and the availability of in person appointments. I also discussed with the patient that there may be a patient responsible charge related to this service. The patient expressed understanding and agreed to proceed.  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  Reports increased irritability for the last several weeks,  denies worsening depression,  feels that her mood has been more labile due to daughter's psychiatric issues,  and family stressors.  No manic symptoms, no true panic attacks, denies lethartgy,  hypersomnia  but has episodes of increased anxiety , requesting a "happy pill"   to take as needed.    ROS: See pertinent positives and negatives per HPI.  No past medical history on file.  No past surgical history on file.  Family History  Problem Relation Age of Onset   Hypertension Mother    Healthy Father     SOCIAL HX:  reports that she has never smoked. She has never used smokeless tobacco. She reports that she does not drink alcohol and does not use drugs.   Current Outpatient Medications:    ALPRAZolam (XANAX) 0.25 MG tablet, Take 1 tablet (0.25 mg total) by mouth 2 (two) times daily as  needed for anxiety., Disp: 20 tablet, Rfl: 0   busPIRone (BUSPAR) 10 MG tablet, Take 1 tablet (10 mg total) by mouth 2 (two) times daily., Disp: 60 tablet, Rfl: 2   albuterol (VENTOLIN HFA) 108 (90 Base) MCG/ACT inhaler, Inhale 2 puffs into the lungs every 6 (six) hours as needed for wheezing or shortness of breath., Disp: 1 each, Rfl: 0   FLUoxetine (PROZAC) 20 MG capsule, Take 2 capsules (40 mg total) by mouth daily., Disp: 180 capsule, Rfl: 1  EXAM:  General impression: alert, cooperative and articulate.  No signs of being in distress  Lungs: speech is fluent sentence length suggests that patient is not short of breath and not punctuated by cough, sneezing or sniffing. Marland Kitchen   Psych: affect normal.  speech is articulate and non pressured .  Denies suicidal thoughts   ASSESSMENT AND PLAN:  Discussed the following assessment and plan:  Colon cancer screening - Plan: Cologuard  Anxiety associated with depression  Anxiety associated with depression Adding buspirone 10 mg bid.  Prn alprazolam max use 20/month.  The risks and benefits of benzodiazepine use were discussed with patient today including excessive sedation leading to respiratory depression,  impaired thinking/driving, and addiction.  Patient was advised to avoid concurrent use with alcohol, to use medication only as needed and not to share with others  .     I discussed the assessment and treatment plan with the patient. The patient was provided an opportunity to ask questions  and all were answered. The patient agreed with the plan and demonstrated an understanding of the instructions.   The patient was advised to call back or seek an in-person evaluation if the symptoms worsen or if the condition fails to improve as anticipated.  Non  I spent 22 minutes dedicated to the care of this patient on the date of this encounter to include pre-visit review of hie medical history,  non  Face-to-face time with the patient  in a telephone visit  , and post visit ordering of testing and therapeutics.    Sherlene Shams, MD

## 2021-11-02 NOTE — Patient Instructions (Signed)
I recommend staying on the 40 mg Prozac dose and adding 10 mg buspirone for the anxiety/irritability. You can start it once daily or twice daily   I have also prescribed a VERY SMALL dose  of alprazolam ("xanax") for PRN USE.when you are feeling incredibly vexed,  angry or panicky.   NOT DAILY

## 2021-11-03 NOTE — Assessment & Plan Note (Signed)
Adding buspirone 10 mg bid.  Prn alprazolam max use 20/month.  The risks and benefits of benzodiazepine use were discussed with patient today including excessive sedation leading to respiratory depression,  impaired thinking/driving, and addiction.  Patient was advised to avoid concurrent use with alcohol, to use medication only as needed and not to share with others  .

## 2021-12-24 ENCOUNTER — Encounter: Payer: Self-pay | Admitting: Internal Medicine

## 2022-01-13 IMAGING — CR DG FINGER LITTLE 2+V*R*
1 series · 3 of 3 positions shown · non-contrast
Comparison: None.

CLINICAL DATA: Trauma to small finger which got caught in a dog
caller this morning. Numbness distally, difficulty straightening the
finger.

EXAM:
RIGHT LITTLE FINGER 2+V

[Series 1: dg finger little right · 0.14mm/px · 3 of 3 slices shown]
[im 1/3]
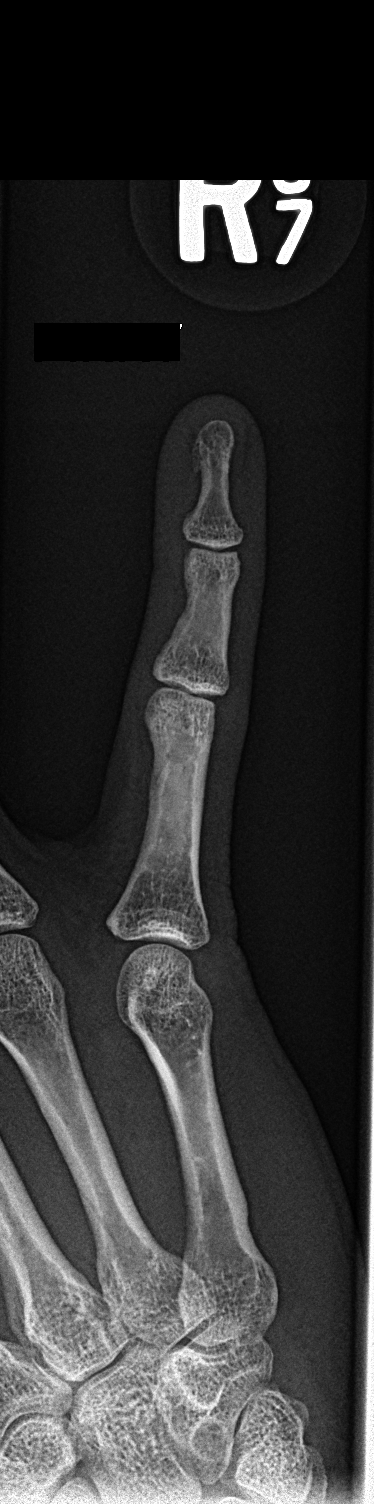
[im 2/3]
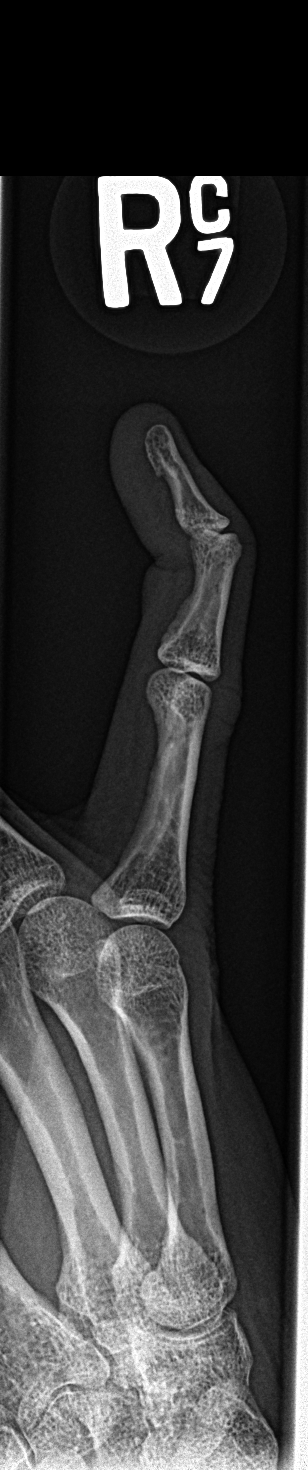
[im 3/3]
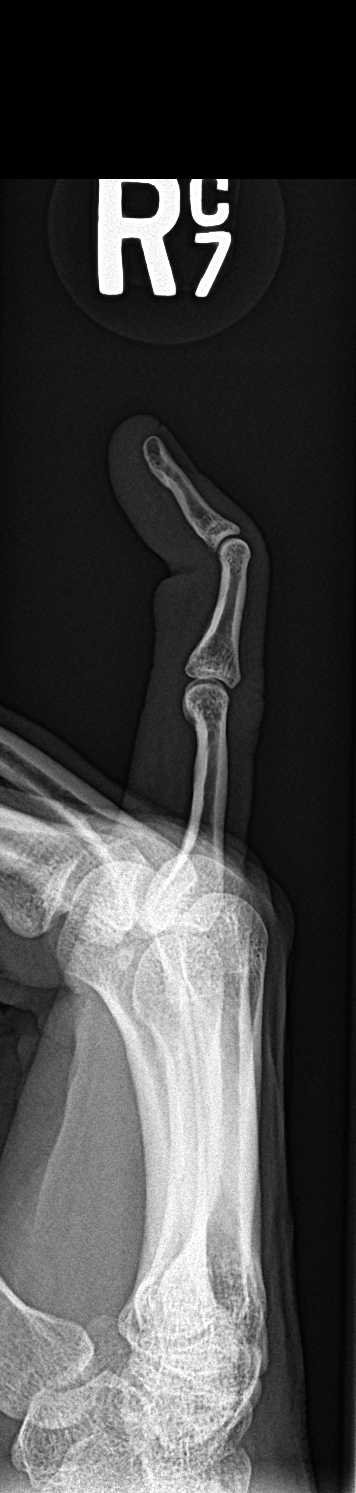

[3 of 3 positions shown; findings below may reference images not displayed]

FINDINGS: The distal interphalangeal joint is flexed approximately 47 degrees.
No appreciable fracture or foreign body. No dislocation is observed.
IMPRESSION: 1. No visible fracture or avulsion. The distal interphalangeal joint
is flexed 47 degrees despite extension of the proximal
interphalangeal joint.

## 2023-01-17 ENCOUNTER — Other Ambulatory Visit: Payer: Self-pay | Admitting: Internal Medicine

## 2023-03-03 ENCOUNTER — Ambulatory Visit: Payer: Self-pay | Admitting: Internal Medicine

## 2023-09-08 ENCOUNTER — Other Ambulatory Visit: Payer: Self-pay | Admitting: Internal Medicine

## 2023-09-08 NOTE — Telephone Encounter (Unsigned)
 Copied from CRM [id number]. Topic: Clinical - Medication Refill >> Sep 08, 2023  1:43 PM Peg Bouton F wrote: Medication:  FLUoxetine  (PROZAC ) 20 MG capsule   Has the patient contacted their pharmacy? No (Agent: If no, request that the patient contact the pharmacy for the refill. If patient does not wish to contact the pharmacy document the reason why and proceed with request.) (Agent: If yes, when and what did the pharmacy advise?)  This is the patient's preferred pharmacy:  [facility] [address], [place] - [address] [place] [address] Phone: [phone] Fax: [phone]  Is this the correct pharmacy for this prescription? Yes If no, delete pharmacy and type the correct one.   Has the prescription been filled recently? No  Is the patient out of the medication? Yes, ran out a few days ago. She mentioned in her appt today but did not follow through with it.   Has the patient been seen for an appointment in the last year OR does the patient have an upcoming appointment? Yes  Can we respond through MyChart? Yes  Agent: Please be advised that Rx refills may take up to 3 business days. We ask that you follow-up with your pharmacy.

## 2023-09-09 MED ORDER — FLUOXETINE HCL 20 MG PO CAPS: 40.0000 mg | ORAL_CAPSULE | Freq: Every day | ORAL | 1 refills | Status: DC

## 2023-09-12 ENCOUNTER — Ambulatory Visit: Payer: Self-pay | Admitting: Internal Medicine

## 2023-09-21 ENCOUNTER — Ambulatory Visit: Payer: Self-pay | Admitting: Internal Medicine

## 2023-10-10 ENCOUNTER — Encounter: Payer: Self-pay | Admitting: Internal Medicine

## 2023-10-10 ENCOUNTER — Telehealth: Payer: Self-pay

## 2023-10-10 ENCOUNTER — Ambulatory Visit (INDEPENDENT_AMBULATORY_CARE_PROVIDER_SITE_OTHER): Payer: Self-pay | Admitting: Internal Medicine

## 2023-10-10 ENCOUNTER — Telehealth: Payer: Self-pay | Admitting: Internal Medicine

## 2023-10-10 VITALS — BP 110/64 | HR 85 | Ht 70.0 in | Wt 148.8 lb

## 2023-10-10 DIAGNOSIS — E785 Hyperlipidemia, unspecified: Secondary | ICD-10-CM

## 2023-10-10 DIAGNOSIS — F418 Other specified anxiety disorders: Secondary | ICD-10-CM

## 2023-10-10 DIAGNOSIS — M722 Plantar fascial fibromatosis: Secondary | ICD-10-CM | POA: Insufficient documentation

## 2023-10-10 DIAGNOSIS — R5383 Other fatigue: Secondary | ICD-10-CM

## 2023-10-10 DIAGNOSIS — R7301 Impaired fasting glucose: Secondary | ICD-10-CM

## 2023-10-10 NOTE — Assessment & Plan Note (Signed)
-   Patient has a longstanding history of anxiety and depression -Her PHQ-9 score today is 2 and her GAD-7 score is 2 as well -Will continue Prozac  40 mg daily -Patient does not need refills at this time -She has a appointment with her PCP in August -No further workup at this time

## 2023-10-10 NOTE — Telephone Encounter (Signed)
 Labs ordered for upcoming appt

## 2023-10-10 NOTE — Assessment & Plan Note (Signed)
-   Patient states that for the last 2 months she has noted a ball on the sole of her left foot which is occasionally painful with standing for a long time or walking long distances -States that when she wears her memory foam she feels better -She denies any pain currently - On exam, she has a firm nodule on the sole of her left foot at the arch between the 2nd and 3rd toe which is mildly tender to palpation -I suspect that she likely has a plantar fascial fibroma versus a Morton's neuroma (less likely) -I put in referral to podiatry for her for possible orthotics and a steroid injection -No further workup at this time

## 2023-10-10 NOTE — Telephone Encounter (Signed)
 Called [employer] to verify ins. Policy termed 3.31.2025, reference # [id number] [facility]

## 2023-10-10 NOTE — Progress Notes (Signed)
 Acute Office Visit  Subjective:     Patient ID: Kendra Mckinney, female    DOB: [birth year], 48 y.o.   MRN: [id number]  Chief Complaint  Patient presents with   knot on ball of left foot    HPI Patient is in today for a ball on the sole of her foot.  Patient states that for the last 2 months she has noted a lump on the sole of her foot which is occasionally painful after standing for long periods of time and feels like a pebble in her shoe.  She is on her feet a lot at work and can get pain after being on her feet for a long period of time.  Patient does have chronic history of anxiety and depression which is relatively well-controlled on her current medication (Prozac )  Review of Systems  Constitutional: Negative.   HENT: Negative.    Respiratory: Negative.    Cardiovascular: Negative.   Gastrointestinal: Negative.   Musculoskeletal:        Patient with a ball on the sole of her left foot which is occasionally painful after walking or standing on her feet for a long periods of time  Neurological: Negative.   Psychiatric/Behavioral: Negative.          Objective:    BP 110/64   Pulse 85   Ht 5' 10 (1.778 m)   Wt 148 lb 12.8 oz (67.5 kg)   SpO2 96%   BMI 21.35 kg/m    Physical Exam Constitutional:      Appearance: Normal appearance.   Cardiovascular:     Rate and Rhythm: Normal rate and regular rhythm.     Heart sounds: Normal heart sounds.  Pulmonary:     Breath sounds: Normal breath sounds. No wheezing or rales.   Musculoskeletal:     Comments: Patient with a nodule on the sole of her left foot on the arch between the 2nd and 3rd toe with mild tenderness to palpation.  No erythema or increased local warmth noted   Neurological:     General: No focal deficit present.     Mental Status: She is alert.   Psychiatric:        Mood and Affect: Mood normal.        Behavior: Behavior normal.     No results found for any visits on 10/10/23.      Assessment &  Plan:   Problem List Items Addressed This Visit       Musculoskeletal and Integument   Fibromatosis of plantar fascia - Primary   - Patient states that for the last 2 months she has noted a ball on the sole of her left foot which is occasionally painful with standing for a long time or walking long distances -States that when she wears her memory foam she feels better -She denies any pain currently - On exam, she has a firm nodule on the sole of her left foot at the arch between the 2nd and 3rd toe which is mildly tender to palpation -I suspect that she likely has a plantar fascial fibroma versus a Morton's neuroma (less likely) -I put in referral to podiatry for her for possible orthotics and a steroid injection -No further workup at this time      Relevant Orders   Ambulatory referral to Podiatry     Other   Anxiety associated with depression   - Patient has a longstanding history of anxiety and depression -Her PHQ-9  score today is 2 and her GAD-7 score is 2 as well -Will continue Prozac  40 mg daily -Patient does not need refills at this time -She has a appointment with her PCP in August -No further workup at this time       No orders of the defined types were placed in this encounter.   No follow-ups on file.  Donyelle Enyeart, MD

## 2023-10-10 NOTE — Patient Instructions (Signed)
-   I suspect that the ball on the sole of her foot is likely a benign growth called a plantar fibroma or possibly a Morton's neuroma -I have put in a referral to podiatry for you to see if we can get you insoles or possibly steroid injection to see if this will help with this issue -Continue with your Prozac  for now. - Please contact us  if your symptoms worsen or if you have any questions or any refills

## 2023-10-25 ENCOUNTER — Ambulatory Visit: Payer: Self-pay | Admitting: Podiatry

## 2023-11-08 ENCOUNTER — Ambulatory Visit: Payer: Self-pay | Admitting: Podiatry

## 2023-11-15 ENCOUNTER — Ambulatory Visit: Payer: Self-pay | Admitting: Podiatry

## 2023-11-23 ENCOUNTER — Encounter: Payer: Self-pay | Admitting: Internal Medicine

## 2023-11-29 ENCOUNTER — Other Ambulatory Visit: Payer: Self-pay | Admitting: Internal Medicine

## 2023-12-13 ENCOUNTER — Ambulatory Visit: Payer: Self-pay | Admitting: Podiatry

## 2024-01-20 ENCOUNTER — Encounter: Payer: Self-pay | Admitting: Internal Medicine

## 2024-04-04 ENCOUNTER — Ambulatory Visit: Payer: Self-pay | Admitting: Internal Medicine

## 2024-04-04 ENCOUNTER — Other Ambulatory Visit (HOSPITAL_COMMUNITY)
Admission: RE | Admit: 2024-04-04 | Discharge: 2024-04-04 | Disposition: A | Discharge status: Home / Self Care | Payer: Self-pay | Source: Ambulatory Visit | Attending: Internal Medicine | Admitting: Internal Medicine

## 2024-04-04 ENCOUNTER — Encounter: Payer: Self-pay | Admitting: Internal Medicine

## 2024-04-04 VITALS — BP 108/72 | HR 78 | Ht 70.0 in | Wt 159.8 lb

## 2024-04-04 DIAGNOSIS — Z1231 Encounter for screening mammogram for malignant neoplasm of breast: Secondary | ICD-10-CM

## 2024-04-04 DIAGNOSIS — N926 Irregular menstruation, unspecified: Secondary | ICD-10-CM

## 2024-04-04 DIAGNOSIS — Z124 Encounter for screening for malignant neoplasm of cervix: Secondary | ICD-10-CM

## 2024-04-04 DIAGNOSIS — F418 Other specified anxiety disorders: Secondary | ICD-10-CM

## 2024-04-04 DIAGNOSIS — Z0000 Encounter for general adult medical examination without abnormal findings: Secondary | ICD-10-CM

## 2024-04-04 MED ORDER — FLUOXETINE HCL 20 MG PO CAPS: 40.0000 mg | ORAL_CAPSULE | Freq: Every day | ORAL | 3 refills | Status: AC

## 2024-04-04 NOTE — Progress Notes (Signed)
 Patient ID: Kendra Mckinney, female    DOB: 12-06-1975  Age: 48 y.o. MRN: [id number]  The patient is here for annual preventive examination and management of other chronic and acute problems.   The risk factors are reflected in the social history.   The roster of all physicians providing medical care to patient - is listed in the Snapshot section of the chart.   Activities of daily living:  The patient is 100% independent in all ADLs: dressing, toileting, feeding as well as independent mobility   Home safety : The patient has smoke detectors in the home. They wear seatbelts.  There are no unsecured firearms at home. There is no violence in the home.    There is no risks for hepatitis, STDs or HIV. There is no   history of blood transfusion. They have no travel history to infectious disease endemic areas of the world.   The patient has seen their dentist in the last six month. They have seen their eye doctor in the last year. The patinet  denies slight hearing difficulty with regard to whispered voices and some television programs.  They have deferred audiologic testing in the last year.  They do not  have excessive sun exposure. Discussed the need for sun protection: hats, long sleeves and use of sunscreen if there is significant sun exposure.    Diet: the importance of a healthy diet is discussed. They do have a healthy diet.   The benefits of regular aerobic exercise were discussed. The patient  exercises  3 to 5 days per week  for  60 minutes.    Depression screen: there are no signs or vegative symptoms of depression- irritability, change in appetite, anhedonia, sadness/tearfullness.   The following portions of the patient's history were reviewed and updated as appropriate: allergies, current medications, past family history, past medical history,  past surgical history, past social history  and problem list.   Visual acuity was not assessed per patient preference since the patient has regular  follow up with an  ophthalmologist. Hearing and body mass index were assessed and reviewed.    During the course of the visit the patient was educated and counseled about appropriate screening and preventive services including : fall prevention , diabetes screening, nutrition counseling, colorectal cancer screening, and recommended immunizations.    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 [name] Does not want to be referred for testing,  does not want anythign that might aggravated her anxiety and caused panic attack to recur,   She continues to have trouble learning new information and committing things to memory    Review of Symptoms  Patient denies headache, fevers, malaise, unintentional weight loss, skin rash, eye pain, sinus congestion and sinus pain, sore throat, dysphagia,  hemoptysis , cough, dyspnea, wheezing, chest pain, palpitations, orthopnea, edema, abdominal pain, nausea, melena, diarrhea, constipation, flank pain, dysuria, hematuria, urinary  Frequency, nocturia, numbness, tingling, seizures,  Focal weakness, Loss of consciousness,  Tremor, insomnia, depression, anxiety, and suicidal ideation.    Physical Exam:  BP 108/72   Pulse 78   Ht 5' 10 (1.778 m)   Wt 159 lb 12.8 oz (72.5 kg)   SpO2 98%   BMI 22.93 kg/m    Physical Exam Vitals reviewed.  Constitutional:  General: She is not in acute distress.    Appearance: Normal appearance. She is well-developed and normal weight. She is not ill-appearing, toxic-appearing or diaphoretic.  HENT:     Head: Normocephalic.     Right Ear: Tympanic membrane, ear canal and external ear normal. There is no impacted cerumen.     Left Ear: Tympanic membrane, ear canal and external ear normal. There is no impacted cerumen.     Nose: Nose normal.     Mouth/Throat:      Mouth: Mucous membranes are moist.     Pharynx: Oropharynx is clear.  Eyes:     General: No scleral icterus.       Right eye: No discharge.        Left eye: No discharge.     Conjunctiva/sclera: Conjunctivae normal.     Pupils: Pupils are equal, round, and reactive to light.  Neck:     Thyroid : No thyromegaly.     Vascular: No carotid bruit or JVD.  Cardiovascular:     Rate and Rhythm: Normal rate and regular rhythm.     Heart sounds: Normal heart sounds.  Pulmonary:     Effort: Pulmonary effort is normal. No respiratory distress.     Breath sounds: Normal breath sounds.  Chest:  Breasts:    Breasts are symmetrical.     Right: Normal. No swelling, inverted nipple, mass, nipple discharge, skin change or tenderness.     Left: Normal. No swelling, inverted nipple, mass, nipple discharge, skin change or tenderness.  Abdominal:     General: Bowel sounds are normal.     Palpations: Abdomen is soft. There is no mass.     Tenderness: There is no abdominal tenderness. There is no guarding or rebound.     Hernia: There is no hernia in the left inguinal area or right inguinal area.  Genitourinary:    Exam position: Lithotomy position.     Pubic Area: No rash or pubic lice.      Labia:        Right: No rash, tenderness, lesion or injury.        Left: No rash, tenderness, lesion or injury.      Vagina: Normal.     Cervix: Normal.     Uterus: Normal.      Adnexa: Right adnexa normal and left adnexa normal.  Musculoskeletal:        General: Normal range of motion.     Cervical back: Normal range of motion and neck supple.  Lymphadenopathy:     Cervical: No cervical adenopathy.     Upper Body:     Right upper body: No supraclavicular, axillary or pectoral adenopathy.     Left upper body: No supraclavicular, axillary or pectoral adenopathy.     Lower Body: No right inguinal adenopathy. No left inguinal adenopathy.  Skin:    General: Skin is warm and dry.  Neurological:     General: No  focal deficit present.     Mental Status: She is alert and oriented to person, place, and time. Mental status is at baseline.  Psychiatric:        Mood and Affect: Mood normal.        Behavior: Behavior normal.        Thought Content: Thought content normal.        Judgment: Judgment normal.     Assessment and Plan: Encounter for screening mammogram for malignant neoplasm of breast -     Digital  Screening Mammogram, Left and Right; Future  Cervical cancer screening -     Cytology - PAP  Encounter for preventive health examination Assessment & Plan: age appropriate education and counseling updated, referrals for preventative services and immunizations addressed, dietary and smoking counseling addressed, most recent labs reviewed.  I have personally reviewed and have noted:   1) the patient's medical and social history 2) The pt's use of alcohol, tobacco, and illicit drugs 3) The patient's current medications and supplements 4) Functional ability including ADL's, fall risk, home safety risk, hearing and visual impairment 5) Diet and physical activities 6) Evidence for depression or mood disorder 7) The patient's height, weight, and BMI have been recorded in the chart   I have made referrals, and provided counseling and education based on review of the above    Anxiety associated with depression -     FLUoxetine  HCl; Take 2 capsules (40 mg total) by mouth daily.  Dispense: 180 capsule; Refill: 3  Irregular menses -     FSH/LH; Future -     CBC with Differential/Platelet; Future  .probdiag  Return in about 6 months (around 10/03/2024).  Verneita LITTIE Kettering, MD

## 2024-04-04 NOTE — Patient Instructions (Addendum)
 I will order your mammogram to be done at [facility] here  [facility] can be postponed until you have insurance..  you will need to schedule a lab appt and fast for 3 hours.   I will initiate the order for your annual colon cancer screening test.  It is called  Cologuard.  It will be delivered to your house, and you will send off a stool sample in the envelope it provides.

## 2024-04-05 NOTE — Assessment & Plan Note (Signed)

## 2024-04-06 LAB — CYTOLOGY - PAP
Comment: NEGATIVE
Diagnosis: NEGATIVE
High risk HPV: NEGATIVE

## 2024-04-08 ENCOUNTER — Ambulatory Visit: Payer: Self-pay | Admitting: Internal Medicine

## 2024-05-04 ENCOUNTER — Other Ambulatory Visit: Payer: Self-pay

## 2024-10-03 ENCOUNTER — Ambulatory Visit: Payer: Self-pay | Admitting: Internal Medicine
